# Patient Record
Sex: Female | Born: 1935 | Race: Black or African American | Hispanic: No | Marital: Married | Smoking: Never smoker
Health system: Southern US, Community
[De-identification: ages and names within clinical notes are randomized; demographics above are authoritative.]

## PROBLEM LIST (undated history)

## (undated) DIAGNOSIS — E785 Hyperlipidemia, unspecified: Secondary | ICD-10-CM

## (undated) DIAGNOSIS — K219 Gastro-esophageal reflux disease without esophagitis: Secondary | ICD-10-CM

## (undated) DIAGNOSIS — I10 Essential (primary) hypertension: Secondary | ICD-10-CM

## (undated) HISTORY — PX: ABDOMINAL HYSTERECTOMY: SHX81

---

## 2005-03-28 ENCOUNTER — Emergency Department: Payer: Self-pay | Admitting: Emergency Medicine

## 2008-10-25 ENCOUNTER — Encounter: Admission: RE | Admit: 2008-10-25 | Discharge: 2008-10-25 | Payer: Self-pay | Admitting: Otolaryngology

## 2010-01-07 ENCOUNTER — Ambulatory Visit: Payer: Self-pay | Admitting: Family Medicine

## 2010-01-07 DIAGNOSIS — J069 Acute upper respiratory infection, unspecified: Secondary | ICD-10-CM | POA: Insufficient documentation

## 2010-01-07 DIAGNOSIS — E785 Hyperlipidemia, unspecified: Secondary | ICD-10-CM

## 2010-01-07 DIAGNOSIS — I10 Essential (primary) hypertension: Secondary | ICD-10-CM | POA: Insufficient documentation

## 2010-08-11 NOTE — Assessment & Plan Note (Signed)
Summary: COLD/JBB   Vital Signs:  Patient Profile:   76 Years Old Female CC:      Cold & URI symptoms Height:     65.25 inches Weight:      218 pounds BMI:     36.13 O2 Sat:      98 % O2 treatment:    Room Air Temp:     97.6 degrees F oral Pulse rate:   68 / minute Pulse rhythm:   regular Resp:     18 per minute BP sitting:   124 / 78  (left arm)  Pt. in pain?   no  Vitals Entered By: Levonne Spiller EMT-P (January 07, 2010 12:55 PM)              Is Patient Diabetic? No      Current Allergies: No known allergies History of Present Illness History from: patient Reason for visit: see chief complaint Chief Complaint: Cold & URI symptoms History of Present Illness: For the last 2 weeks has been having a chronic runny nose and cough. The cough is non productive and worse at night. It has felt like it was getting better, but has lingered. she works at a daycare.  No f/c. No sore throat. + hoarseness.  REVIEW OF SYSTEMS Constitutional Symptoms      Denies fever, chills, night sweats, weight loss, weight gain, and fatigue.  Eyes       Denies change in vision, eye pain, eye discharge, glasses, contact lenses, and eye surgery. Ear/Nose/Throat/Mouth       Complains of sinus problems.      Denies hearing loss/aids, change in hearing, ear pain, ear discharge, dizziness, frequent runny nose, frequent nose bleeds, sore throat, hoarseness, and tooth pain or bleeding.  Respiratory       Complains of dry cough.      Denies productive cough, wheezing, shortness of breath, asthma, bronchitis, and emphysema/COPD.  Cardiovascular       Denies murmurs, chest pain, and tires easily with exhertion.    Gastrointestinal       Denies stomach pain, nausea/vomiting, diarrhea, constipation, blood in bowel movements, and indigestion. Genitourniary       Denies painful urination, kidney stones, and loss of urinary control. Neurological       Denies paralysis, seizures, and  fainting/blackouts. Musculoskeletal       Denies muscle pain, joint pain, joint stiffness, decreased range of motion, redness, swelling, muscle weakness, and gout.  Skin       Denies bruising, unusual mles/lumps or sores, and hair/skin or nail changes.  Psych       Denies mood changes, temper/anger issues, anxiety/stress, speech problems, depression, and sleep problems.  Past History:  Past Medical History: Hyperlipidemia Hypertension  Social History: Occupation: Building surveyor Never Smoked Alcohol use-no Smoking Status:  never Physical Exam General appearance: well developed, well nourished, no acute distress - hoarse voice Head: no sinus tenderness Eyes: conjunctivae and lids normal Oral/Pharynx: post nasal drainage - clear. + erythema Neck: supple,anterior lymphadenopathy present - nontender Chest/Lungs: no rales, wheezes, or rhonchi bilateral, breath sounds equal without effort Heart: regular rate and  rhythm, no murmur MSE: oriented to time, place, and person Assessment New Problems: UPPER RESPIRATORY INFECTION, ACUTE (ICD-465.9) HYPERTENSION (ICD-401.9) HYPERLIPIDEMIA (ICD-272.4)   Plan New Medications/Changes: PROMETHAZINE-DM 6.25-15 MG/5ML SYRP (PROMETHAZINE-DM) 1-2 tsp by mouth Q6 hours as needed cough  #120 x 0, 01/07/2010, Tacey Ruiz MD AMOXICILLIN 500 MG CAPS (AMOXICILLIN) 1 by mouth two times a day  x 10 days  #20 x 0, 01/07/2010, Tacey Ruiz MD  New Orders: New Patient Level II [phone]  The patient and/or caregiver has been counseled thoroughly with regard to medications prescribed including dosage, schedule, interactions, rationale for use, and possible side effects and they verbalize understanding.  Diagnoses and expected course of recovery discussed and will return if not improved as expected or if the condition worsens. Patient and/or caregiver verbalized understanding.  Prescriptions: PROMETHAZINE-DM 6.25-15 MG/5ML SYRP (PROMETHAZINE-DM) 1-2 tsp by mouth  Q6 hours as needed cough  #120 x 0   Entered and Authorized by:   Tacey Ruiz MD   Signed by:   Tacey Ruiz MD on 01/07/2010   Method used:   Electronically to        [facility] [address] (retail)       [address], [address]       [place], [place]  [address]       Ph: [phone]       Fax: [phone]   RxID:   [id number] AMOXICILLIN 500 MG CAPS (AMOXICILLIN) 1 by mouth two times a day x 10 days  #20 x 0   Entered and Authorized by:   Tacey Ruiz MD   Signed by:   Tacey Ruiz MD on 01/07/2010   Method used:   Electronically to        [facility] [address] (retail)       [address], [address]       [place], [place]  [address]       Ph: [phone]       Fax: [phone]   RxID:   [id number]   Orders Added: 1)  New Patient Level II [99202]  The risks, benefits and possible side effects of the treatments and tests were explained clearly to the patient and the patient verbalized understanding.  The patient was informed that there is no on-call provider or services available at this clinic during off-hours (when the clinic is closed).  If the patient developed a problem or concern that required immediate attention, the patient was advised to go the the nearest available urgent care or emergency department for medical care.  The patient verbalized understanding.

## 2010-09-08 ENCOUNTER — Emergency Department: Payer: Self-pay

## 2019-04-27 ENCOUNTER — Other Ambulatory Visit: Payer: Self-pay

## 2019-04-27 DIAGNOSIS — Z20822 Contact with and (suspected) exposure to covid-19: Secondary | ICD-10-CM

## 2019-04-29 LAB — NOVEL CORONAVIRUS, NAA: SARS-CoV-2, NAA: NOT DETECTED

## 2019-05-17 ENCOUNTER — Other Ambulatory Visit: Payer: Self-pay | Admitting: *Deleted

## 2019-05-17 DIAGNOSIS — Z20822 Contact with and (suspected) exposure to covid-19: Secondary | ICD-10-CM

## 2019-05-18 LAB — NOVEL CORONAVIRUS, NAA: SARS-CoV-2, NAA: NOT DETECTED

## 2019-08-20 ENCOUNTER — Ambulatory Visit: Payer: Medicare Other | Attending: Internal Medicine

## 2019-08-20 DIAGNOSIS — Z23 Encounter for immunization: Secondary | ICD-10-CM | POA: Insufficient documentation

## 2019-09-14 ENCOUNTER — Ambulatory Visit: Payer: Medicare Other | Attending: Internal Medicine

## 2019-09-14 DIAGNOSIS — Z23 Encounter for immunization: Secondary | ICD-10-CM | POA: Insufficient documentation

## 2019-09-14 NOTE — Progress Notes (Signed)
   Covid-19 Vaccination Clinic  Name:  Sarah Higgins    MRN: [id number] DOB: 1934/10/05  09/14/2019  Sarah Higgins was observed post Covid-19 immunization for 15 minutes without incident. She was provided with Vaccine Information Sheet and instruction to access the V-Safe system.   Sarah Higgins was instructed to call 911 with any severe reactions post vaccine: Marland Kitchen Difficulty breathing  . Swelling of face and throat  . A fast heartbeat  . A bad rash all over body  . Dizziness and weakness   Immunizations Administered    Name Date Dose VIS Date Route   Pfizer COVID-19 Vaccine 09/14/2019  1:46 PM 0.3 mL 06/22/2019 Intramuscular   Manufacturer: [employer], [employer]   Lot: [id number]   NDC: [address]-5

## 2020-11-29 ENCOUNTER — Emergency Department: Payer: Medicare Other

## 2020-11-29 ENCOUNTER — Inpatient Hospital Stay
Admission: EM | Admit: 2020-11-29 | Discharge: 2020-12-03 | DRG: 494 | Disposition: A | Discharge status: Skilled Nursing Facility | Payer: Medicare Other | Attending: Internal Medicine | Admitting: Internal Medicine

## 2020-11-29 ENCOUNTER — Other Ambulatory Visit: Payer: Self-pay

## 2020-11-29 ENCOUNTER — Inpatient Hospital Stay: Payer: Medicare Other

## 2020-11-29 ENCOUNTER — Encounter: Payer: Self-pay | Admitting: Orthopedic Surgery

## 2020-11-29 DIAGNOSIS — T17908A Unspecified foreign body in respiratory tract, part unspecified causing other injury, initial encounter: Secondary | ICD-10-CM

## 2020-11-29 DIAGNOSIS — W19XXXA Unspecified fall, initial encounter: Secondary | ICD-10-CM | POA: Diagnosis present

## 2020-11-29 DIAGNOSIS — Z8249 Family history of ischemic heart disease and other diseases of the circulatory system: Secondary | ICD-10-CM | POA: Diagnosis not present

## 2020-11-29 DIAGNOSIS — S82892A Other fracture of left lower leg, initial encounter for closed fracture: Secondary | ICD-10-CM | POA: Insufficient documentation

## 2020-11-29 DIAGNOSIS — S82852D Displaced trimalleolar fracture of left lower leg, subsequent encounter for closed fracture with routine healing: Secondary | ICD-10-CM | POA: Diagnosis not present

## 2020-11-29 DIAGNOSIS — I10 Essential (primary) hypertension: Secondary | ICD-10-CM | POA: Diagnosis present

## 2020-11-29 DIAGNOSIS — Z20822 Contact with and (suspected) exposure to covid-19: Secondary | ICD-10-CM | POA: Diagnosis present

## 2020-11-29 DIAGNOSIS — N182 Chronic kidney disease, stage 2 (mild): Secondary | ICD-10-CM | POA: Diagnosis present

## 2020-11-29 DIAGNOSIS — I129 Hypertensive chronic kidney disease with stage 1 through stage 4 chronic kidney disease, or unspecified chronic kidney disease: Secondary | ICD-10-CM | POA: Diagnosis present

## 2020-11-29 DIAGNOSIS — I340 Nonrheumatic mitral (valve) insufficiency: Secondary | ICD-10-CM | POA: Diagnosis not present

## 2020-11-29 DIAGNOSIS — S9305XA Dislocation of left ankle joint, initial encounter: Secondary | ICD-10-CM

## 2020-11-29 DIAGNOSIS — D72829 Elevated white blood cell count, unspecified: Secondary | ICD-10-CM | POA: Diagnosis present

## 2020-11-29 DIAGNOSIS — Z90710 Acquired absence of both cervix and uterus: Secondary | ICD-10-CM | POA: Diagnosis not present

## 2020-11-29 DIAGNOSIS — E876 Hypokalemia: Secondary | ICD-10-CM

## 2020-11-29 DIAGNOSIS — K219 Gastro-esophageal reflux disease without esophagitis: Secondary | ICD-10-CM | POA: Diagnosis present

## 2020-11-29 DIAGNOSIS — W1830XA Fall on same level, unspecified, initial encounter: Secondary | ICD-10-CM | POA: Diagnosis present

## 2020-11-29 DIAGNOSIS — S82852A Displaced trimalleolar fracture of left lower leg, initial encounter for closed fracture: Principal | ICD-10-CM | POA: Diagnosis present

## 2020-11-29 DIAGNOSIS — K08109 Complete loss of teeth, unspecified cause, unspecified class: Secondary | ICD-10-CM | POA: Diagnosis present

## 2020-11-29 DIAGNOSIS — S8264XA Nondisplaced fracture of lateral malleolus of right fibula, initial encounter for closed fracture: Secondary | ICD-10-CM | POA: Diagnosis present

## 2020-11-29 DIAGNOSIS — R55 Syncope and collapse: Secondary | ICD-10-CM | POA: Diagnosis present

## 2020-11-29 DIAGNOSIS — M25559 Pain in unspecified hip: Secondary | ICD-10-CM

## 2020-11-29 DIAGNOSIS — Z79899 Other long term (current) drug therapy: Secondary | ICD-10-CM | POA: Diagnosis not present

## 2020-11-29 DIAGNOSIS — Y9301 Activity, walking, marching and hiking: Secondary | ICD-10-CM | POA: Diagnosis present

## 2020-11-29 DIAGNOSIS — I351 Nonrheumatic aortic (valve) insufficiency: Secondary | ICD-10-CM | POA: Diagnosis not present

## 2020-11-29 DIAGNOSIS — T148XXA Other injury of unspecified body region, initial encounter: Secondary | ICD-10-CM

## 2020-11-29 DIAGNOSIS — R609 Edema, unspecified: Secondary | ICD-10-CM

## 2020-11-29 DIAGNOSIS — E785 Hyperlipidemia, unspecified: Secondary | ICD-10-CM | POA: Diagnosis present

## 2020-11-29 HISTORY — DX: Essential (primary) hypertension: I10

## 2020-11-29 HISTORY — DX: Gastro-esophageal reflux disease without esophagitis: K21.9

## 2020-11-29 HISTORY — DX: Hyperlipidemia, unspecified: E78.5

## 2020-11-29 LAB — CBC WITH DIFFERENTIAL/PLATELET
Abs Immature Granulocytes: 0.09 10*3/uL — ABNORMAL HIGH (ref 0.00–0.07)
Basophils Absolute: 0.1 10*3/uL (ref 0.0–0.1)
Basophils Relative: 0 %
Eosinophils Absolute: 0.3 10*3/uL (ref 0.0–0.5)
Eosinophils Relative: 2 %
HCT: 39.7 % (ref 36.0–46.0)
Hemoglobin: 12.8 g/dL (ref 12.0–15.0)
Immature Granulocytes: 1 %
Lymphocytes Relative: 27 %
Lymphs Abs: 4.7 10*3/uL — ABNORMAL HIGH (ref 0.7–4.0)
MCH: 27.2 pg (ref 26.0–34.0)
MCHC: 32.2 g/dL (ref 30.0–36.0)
MCV: 84.3 fL (ref 80.0–100.0)
Monocytes Absolute: 1.6 10*3/uL — ABNORMAL HIGH (ref 0.1–1.0)
Monocytes Relative: 9 %
Neutro Abs: 10.5 10*3/uL — ABNORMAL HIGH (ref 1.7–7.7)
Neutrophils Relative %: 61 %
Platelets: 236 10*3/uL (ref 150–400)
RBC: 4.71 MIL/uL (ref 3.87–5.11)
RDW: 12.6 % (ref 11.5–15.5)
Smear Review: NORMAL
WBC: 17.2 10*3/uL — ABNORMAL HIGH (ref 4.0–10.5)
nRBC: 0 % (ref 0.0–0.2)

## 2020-11-29 LAB — TROPONIN I (HIGH SENSITIVITY): Troponin I (High Sensitivity): 5 ng/L (ref <18)

## 2020-11-29 LAB — COMPREHENSIVE METABOLIC PANEL
ALT: 16 U/L (ref 0–44)
AST: 20 U/L (ref 15–41)
Albumin: 3.8 g/dL (ref 3.5–5.0)
Alkaline Phosphatase: 83 U/L (ref 38–126)
Anion gap: 8 (ref 5–15)
BUN: 15 mg/dL (ref 8–23)
CO2: 28 mmol/L (ref 22–32)
Calcium: 9.2 mg/dL (ref 8.9–10.3)
Chloride: 102 mmol/L (ref 98–111)
Creatinine, Ser: 1.06 mg/dL — ABNORMAL HIGH (ref 0.44–1.00)
GFR, Estimated: 52 mL/min — ABNORMAL LOW (ref >60)
Glucose, Bld: 99 mg/dL (ref 70–99)
Potassium: 3.5 mmol/L (ref 3.5–5.1)
Sodium: 138 mmol/L (ref 135–145)
Total Bilirubin: 0.7 mg/dL (ref 0.3–1.2)
Total Protein: 7.7 g/dL (ref 6.5–8.1)

## 2020-11-29 LAB — LIPASE, BLOOD: Lipase: 73 U/L — ABNORMAL HIGH (ref 11–51)

## 2020-11-29 LAB — CBG MONITORING, ED: Glucose-Capillary: 101 mg/dL — ABNORMAL HIGH (ref 70–99)

## 2020-11-29 LAB — BRAIN NATRIURETIC PEPTIDE: B Natriuretic Peptide: 27.2 pg/mL (ref 0.0–100.0)

## 2020-11-29 LAB — SURGICAL PCR SCREEN
MRSA, PCR: POSITIVE — AB
Staphylococcus aureus: POSITIVE — AB

## 2020-11-29 LAB — TYPE AND SCREEN
ABO/RH(D): O POS
Antibody Screen: NEGATIVE

## 2020-11-29 LAB — RESP PANEL BY RT-PCR (FLU A&B, COVID) ARPGX2
Influenza A by PCR: NEGATIVE
Influenza B by PCR: NEGATIVE
SARS Coronavirus 2 by RT PCR: NEGATIVE

## 2020-11-29 LAB — PROTIME-INR
INR: 1.1 (ref 0.8–1.2)
Prothrombin Time: 14.4 seconds (ref 11.4–15.2)

## 2020-11-29 LAB — APTT: aPTT: 28 seconds (ref 24–36)

## 2020-11-29 LAB — MAGNESIUM: Magnesium: 1.6 mg/dL — ABNORMAL LOW (ref 1.7–2.4)

## 2020-11-29 MED ORDER — VENLAFAXINE HCL ER 150 MG PO CP24
150.0000 mg | ORAL_CAPSULE | Freq: Every day | ORAL | Status: DC
  Administered 2020-11-30 – 2020-12-03 (×4): 150 mg via ORAL
  Filled 2020-11-29 (×4): qty 1

## 2020-11-29 MED ORDER — LOPERAMIDE HCL 2 MG PO CAPS: 4.0000 mg | ORAL_CAPSULE | Freq: Two times a day (BID) | ORAL | Status: DC | PRN

## 2020-11-29 MED ORDER — METHOCARBAMOL 500 MG PO TABS
500.0000 mg | ORAL_TABLET | Freq: Three times a day (TID) | ORAL | Status: DC | PRN
  Administered 2020-11-30 (×2): 500 mg via ORAL
  Filled 2020-11-29 (×3): qty 1

## 2020-11-29 MED ORDER — MORPHINE SULFATE (PF) 4 MG/ML IV SOLN
4.0000 mg | Freq: Once | INTRAVENOUS | Status: AC
  Administered 2020-11-29: 4 mg via INTRAVENOUS

## 2020-11-29 MED ORDER — LEVOCETIRIZINE DIHYDROCHLORIDE 5 MG PO TABS: 5.0000 mg | ORAL_TABLET | Freq: Every day | ORAL | Status: DC

## 2020-11-29 MED ORDER — LORATADINE 10 MG PO TABS
10.0000 mg | ORAL_TABLET | Freq: Every day | ORAL | Status: DC
  Administered 2020-11-30 – 2020-12-03 (×4): 10 mg via ORAL
  Filled 2020-11-29 (×4): qty 1

## 2020-11-29 MED ORDER — SODIUM CHLORIDE 0.9 % IV SOLN: INTRAVENOUS | Status: DC

## 2020-11-29 MED ORDER — METHOCARBAMOL 500 MG PO TABS
500.0000 mg | ORAL_TABLET | Freq: Four times a day (QID) | ORAL | Status: DC | PRN
  Filled 2020-11-29: qty 1

## 2020-11-29 MED ORDER — GABAPENTIN 100 MG PO CAPS
200.0000 mg | ORAL_CAPSULE | Freq: Three times a day (TID) | ORAL | Status: DC
  Administered 2020-11-29 – 2020-12-03 (×10): 200 mg via ORAL
  Filled 2020-11-29 (×10): qty 2

## 2020-11-29 MED ORDER — FLUTICASONE PROPIONATE 50 MCG/ACT NA SUSP
1.0000 | Freq: Every day | NASAL | Status: DC | PRN
  Filled 2020-11-29: qty 16

## 2020-11-29 MED ORDER — MORPHINE SULFATE (PF) 4 MG/ML IV SOLN
4.0000 mg | Freq: Once | INTRAVENOUS | Status: DC
  Filled 2020-11-29: qty 1

## 2020-11-29 MED ORDER — BISACODYL 5 MG PO TBEC: 5.0000 mg | DELAYED_RELEASE_TABLET | Freq: Every day | ORAL | Status: DC | PRN

## 2020-11-29 MED ORDER — HYDROCODONE-ACETAMINOPHEN 5-325 MG PO TABS
1.0000 | ORAL_TABLET | Freq: Four times a day (QID) | ORAL | Status: DC | PRN
Start: 2020-11-29 — End: 2020-11-29

## 2020-11-29 MED ORDER — DOCUSATE SODIUM 100 MG PO CAPS: 100.0000 mg | ORAL_CAPSULE | Freq: Two times a day (BID) | ORAL | Status: DC

## 2020-11-29 MED ORDER — MAGNESIUM SULFATE 2 GM/50ML IV SOLN
2.0000 g | Freq: Once | INTRAVENOUS | Status: AC
  Administered 2020-11-29: 2 g via INTRAVENOUS
  Filled 2020-11-29: qty 50

## 2020-11-29 MED ORDER — OXYBUTYNIN CHLORIDE ER 5 MG PO TB24
10.0000 mg | ORAL_TABLET | Freq: Every day | ORAL | Status: DC
  Administered 2020-11-30 – 2020-12-03 (×4): 10 mg via ORAL
  Filled 2020-11-29 (×3): qty 2
  Filled 2020-11-29: qty 1

## 2020-11-29 MED ORDER — METHOCARBAMOL 1000 MG/10ML IJ SOLN
500.0000 mg | Freq: Four times a day (QID) | INTRAVENOUS | Status: DC | PRN
  Filled 2020-11-29: qty 5

## 2020-11-29 MED ORDER — BENZONATATE 100 MG PO CAPS: 100.0000 mg | ORAL_CAPSULE | Freq: Three times a day (TID) | ORAL | Status: DC | PRN

## 2020-11-29 MED ORDER — PRAVASTATIN SODIUM 20 MG PO TABS
20.0000 mg | ORAL_TABLET | Freq: Every day | ORAL | Status: DC
  Administered 2020-11-29 – 2020-12-02 (×4): 20 mg via ORAL
  Filled 2020-11-29 (×4): qty 1

## 2020-11-29 MED ORDER — SENNOSIDES-DOCUSATE SODIUM 8.6-50 MG PO TABS: 1.0000 | ORAL_TABLET | Freq: Every evening | ORAL | Status: DC | PRN

## 2020-11-29 MED ORDER — CEFAZOLIN SODIUM-DEXTROSE 2-4 GM/100ML-% IV SOLN
2.0000 g | INTRAVENOUS | Status: AC
  Administered 2020-11-30: 2 g via INTRAVENOUS
  Filled 2020-11-29 (×2): qty 100

## 2020-11-29 MED ORDER — FLEET ENEMA 7-19 GM/118ML RE ENEM: 1.0000 | ENEMA | Freq: Once | RECTAL | Status: DC | PRN

## 2020-11-29 MED ORDER — ONDANSETRON HCL 4 MG/2ML IJ SOLN: 4.0000 mg | Freq: Three times a day (TID) | INTRAMUSCULAR | Status: DC | PRN

## 2020-11-29 MED ORDER — LATANOPROST 0.005 % OP SOLN
1.0000 [drp] | Freq: Every day | OPHTHALMIC | Status: DC
  Administered 2020-11-29 – 2020-12-02 (×4): 1 [drp] via OPHTHALMIC
  Filled 2020-11-29: qty 2.5

## 2020-11-29 MED ORDER — HYDRALAZINE HCL 20 MG/ML IJ SOLN: 5.0000 mg | INTRAMUSCULAR | Status: DC | PRN

## 2020-11-29 MED ORDER — PANTOPRAZOLE SODIUM 40 MG PO TBEC
40.0000 mg | DELAYED_RELEASE_TABLET | Freq: Every day | ORAL | Status: DC
  Administered 2020-11-30 – 2020-12-03 (×4): 40 mg via ORAL
  Filled 2020-11-29 (×4): qty 1

## 2020-11-29 MED ORDER — MORPHINE SULFATE (PF) 2 MG/ML IV SOLN
0.5000 mg | INTRAVENOUS | Status: DC | PRN
  Administered 2020-11-30 (×3): 0.5 mg via INTRAVENOUS
  Filled 2020-11-29 (×3): qty 1

## 2020-11-29 MED ORDER — OXYCODONE-ACETAMINOPHEN 5-325 MG PO TABS
1.0000 | ORAL_TABLET | ORAL | Status: DC | PRN
  Administered 2020-11-29 – 2020-11-30 (×2): 1 via ORAL
  Filled 2020-11-29 (×2): qty 1

## 2020-11-29 MED ORDER — ACETAMINOPHEN 325 MG PO TABS
650.0000 mg | ORAL_TABLET | Freq: Four times a day (QID) | ORAL | Status: DC | PRN
  Administered 2020-11-30 – 2020-12-01 (×3): 650 mg via ORAL
  Filled 2020-11-29 (×4): qty 2

## 2020-11-29 MED ORDER — MORPHINE SULFATE (PF) 2 MG/ML IV SOLN: 2.0000 mg | INTRAVENOUS | Status: DC | PRN

## 2020-11-29 NOTE — ED Notes (Signed)
Ortho cart placed by room.

## 2020-11-29 NOTE — ED Notes (Signed)
Left ankle elevated and ice pack applied. Capillary refill <3 seconds, skin is warm, CMS is intact. Obvious deformity noted.

## 2020-11-29 NOTE — Plan of Care (Signed)
Lab contacted to inform of positive MRSA (nares) - Dr. And floor RN Jacinto Halim) informed.

## 2020-11-29 NOTE — ED Provider Notes (Addendum)
[facility] Emergency Department Provider Note  ____________________________________________   Event Date/Time   First MD Initiated Contact with Patient 11/29/20 1624     (approximate)  I have reviewed the triage vital signs and the nursing notes.   HISTORY  Chief Complaint Loss of Consciousness    HPI Sarah Higgins is a 86 y.o. female who presents by EMS after a syncopal episode, collapse, and obvious ankle injury.  She reports that she was walking to the bathroom when she suddenly passed out.  She fell and does not believe she hit her head or anything else, but she twisted her ankle and has severe sharp ankle pain  which is worse if she moves it.  She has no other pain.  At no point has she had chest pain or shortness of breath.  She has had no nausea or vomiting.  She denies headache, neck pain, and back pain.  No recent dysuria.  Nothing in particular makes her symptoms better.  She has never had passing out episodes before.  She is not on blood thinners.        Past Medical History:  Diagnosis Date  . HLD (hyperlipidemia)   . Hypertension     Patient Active Problem List   Diagnosis Date Noted  . Trimalleolar fracture of left ankle 11/29/2020  . HLD (hyperlipidemia) 11/29/2020  . Syncope 11/29/2020  . Fall 11/29/2020  . Hypomagnesemia 11/29/2020  . Closed left ankle fracture 11/29/2020  . HYPERLIPIDEMIA 01/07/2010  . HTN (hypertension) 01/07/2010  . UPPER RESPIRATORY INFECTION, ACUTE 01/07/2010    History reviewed. No pertinent surgical history.  Prior to Admission medications   Medication Sig Start Date End Date Taking? Authorizing Provider  benzonatate (TESSALON) 100 MG capsule Take 100 mg by mouth 3 (three) times daily as needed for cough.   Yes [provider]  fluticasone (FLONASE) 50 MCG/ACT nasal spray Place 1 spray into both nostrils daily.   Yes [provider]  gabapentin (NEURONTIN) 100 MG capsule Take 200 mg  by mouth 3 (three) times daily.   Yes [provider]  latanoprost (XALATAN) 0.005 % ophthalmic solution Place 1 drop into both eyes at bedtime.   Yes [provider]  levocetirizine (XYZAL) 5 MG tablet Take 5 mg by mouth daily.   Yes [provider]  loperamide (IMODIUM) 2 MG capsule Take 4 mg by mouth 2 (two) times daily as needed for diarrhea or loose stools.   Yes [provider]  lovastatin (MEVACOR) 20 MG tablet Take 20 mg by mouth every evening.   Yes [provider]  omeprazole (PRILOSEC) 40 MG capsule Take 40 mg by mouth daily.   Yes [provider]  oxybutynin (DITROPAN-XL) 10 MG 24 hr tablet Take 10 mg by mouth daily.   Yes [provider]  sodium chloride HYPERTONIC 3 % nebulizer solution Take 4 mLs by nebulization 2 (two) times daily.   Yes [provider]  venlafaxine XR (EFFEXOR-XR) 150 MG 24 hr capsule Take 150 mg by mouth daily.   Yes [provider]    Allergies Patient has no known allergies.  History reviewed. No pertinent family history.  Social History Social History   Tobacco Use  . Smoking status: Never Smoker  . Smokeless tobacco: Never Used    Review of Systems Constitutional: No fever/chills Eyes: No visual changes. ENT: No sore throat. Cardiovascular: Syncope and collapse.  Denies chest pain. Respiratory: Denies shortness of breath. Gastrointestinal: No abdominal  pain.  No nausea, no vomiting.  No diarrhea.  No constipation. Genitourinary: Negative for dysuria. Musculoskeletal: Left ankle pain with obvious deformity. Integumentary: Negative for rash. Neurological: Negative for headaches, focal weakness or numbness.   ____________________________________________   PHYSICAL EXAM:  VITAL SIGNS: ED Triage Vitals  Enc Vitals Group     BP 11/29/20 1512 (!) 91/50     Pulse Rate 11/29/20 1512 80     Resp 11/29/20 1512 16     Temp 11/29/20 1512 98.2 F (36.8 C)      Temp Source 11/29/20 1512 Oral     SpO2 11/29/20 1512 94 %     Weight 11/29/20 1507 86 kg (189 lb 9.5 oz)     Height 11/29/20 1507 1.702 m (5\' 7" )     Head Circumference --      Peak Flow --      Pain Score 11/29/20 1525 3     Pain Loc --      Pain Edu? --      Excl. in GC? --     Constitutional: Alert and oriented.  Eyes: Conjunctivae are normal.  Head: Atraumatic. Nose: No congestion/rhinnorhea. Mouth/Throat: Patient is wearing a mask. Neck: No stridor.  No meningeal signs.   Cardiovascular: Normal rate, regular rhythm. Good peripheral circulation. Respiratory: Normal respiratory effort.  No retractions. Gastrointestinal: Soft and nontender. No distention.  Musculoskeletal: Obvious left ankle deformity consistent with fracture/dislocation.  Easily palpable distal pulses in the top of the foot.  There is some tenting of the medial malleolus but it is not an open fracture/dislocation.  No other obvious musculoskeletal issues.  She has no tenderness to palpation along the cervical spine.  No upper extremity issues. Neurologic:  Normal speech and language. No gross focal neurologic deficits are appreciated.  Skin:  Skin is warm, dry and intact. Psychiatric: Mood and affect are normal. Speech and behavior are normal.  ____________________________________________   LABS (all labs ordered are listed, but only abnormal results are displayed)  Labs Reviewed  CBC WITH DIFFERENTIAL/PLATELET - Abnormal; Notable for the following components:      Result Value   WBC 17.2 (*)    Neutro Abs 10.5 (*)    Lymphs Abs 4.7 (*)    Monocytes Absolute 1.6 (*)    Abs Immature Granulocytes 0.09 (*)    All other components within normal limits  COMPREHENSIVE METABOLIC PANEL - Abnormal; Notable for the following components:   Creatinine, Ser 1.06 (*)    GFR, Estimated 52 (*)    All other components within normal limits  LIPASE, BLOOD - Abnormal; Notable for the following components:   Lipase 73 (*)     All other components within normal limits  MAGNESIUM - Abnormal; Notable for the following components:   Magnesium 1.6 (*)    All other components within normal limits  CBG MONITORING, ED - Abnormal; Notable for the following components:   Glucose-Capillary 101 (*)    All other components within normal limits  RESP PANEL BY RT-PCR (FLU A&B, COVID) ARPGX2  SURGICAL PCR SCREEN  PROTIME-INR  APTT  BRAIN NATRIURETIC PEPTIDE  CBC  TYPE AND SCREEN  TROPONIN I (HIGH SENSITIVITY)   ____________________________________________  EKG  ED ECG REPORT I, 12/01/20, the attending physician, personally viewed and interpreted this ECG.  Date: 11/29/2020 EKG Time: 15:09 Rate: 77 Rhythm: normal sinus rhythm QRS Axis: normal Intervals: normal ST/T Wave abnormalities: normal Narrative Interpretation: no evidence of acute ischemia    ____________________________________________  RADIOLOGY  I, Loleta Roseory Alden Bensinger, personally viewed and evaluated these images (plain radiographs) as part of my medical decision making, as well as reviewing the written report by the radiologist.  ED MD interpretation: Left trimalleolar fracture  Official radiology report(s): DG Ankle Complete Left  Result Date: 11/29/2020 CLINICAL DATA:  Fall, pain, deformity EXAM: LEFT ANKLE COMPLETE - 3+ VIEW COMPARISON:  None. FINDINGS: There is fracture dislocation at the left ankle. Distal fibular metaphyseal fracture and medial malleolar fracture. Possible posterior malleolar fracture although difficult to determine/visualize. Lateral dislocation of the talus relative to the tibia. IMPRESSION: Left ankle fracture dislocation as above. Electronically Signed   By: Charlett NoseKevin  Dover M.D.   On: 11/29/2020 15:30    ____________________________________________   PROCEDURES   Procedure(s) performed (including Critical Care):  .Ortho Injury Treatment  Date/Time: 11/29/2020 5:03 PM Performed by: Loleta RoseForbach, Adeyemi Hamad, MD Authorized by:  Loleta RoseForbach, Butler Vegh, MD   Consent:    Consent obtained:  Verbal and emergent situation   Consent given by:  Patient   Risks discussed:  Fracture, irreducible dislocation and nerve damageInjury location: ankle Location details: left ankle Injury type: fracture-dislocation Fracture type: trimalleolar Pre-procedure neurovascular assessment: neurovascularly intact Pre-procedure distal perfusion: normal Pre-procedure neurological function: normal Pre-procedure range of motion: reduced  Anesthesia: Local anesthesia used: no  Patient sedated: NoManipulation performed: yes Reduction successful: yes (improved position but still not ideal) X-ray confirmed reduction: yes Immobilization: splint Splint type: short leg Splint Applied by: ED Provider and ED Tech Supplies used: Ortho-Glass Post-procedure neurovascular assessment: post-procedure neurovascularly intact Post-procedure distal perfusion: normal Post-procedure neurological function: normal Post-procedure range of motion: unchanged Comments: Postreduction, the patient is able to wiggle her toes, has no significant pain, no loss of sensation, and still has palpable pulse at the top of her foot.  Marland Kitchen.1-3 Lead EKG Interpretation Performed by: Loleta RoseForbach, Phong Isenberg, MD Authorized by: Loleta RoseForbach, Emillio Ngo, MD     Interpretation: normal     ECG rate:  79   Rhythm: sinus rhythm     Ectopy: none     Conduction: normal       ____________________________________________   INITIAL IMPRESSION / MDM / ASSESSMENT AND PLAN / ED COURSE  As part of my medical decision making, I reviewed the following data within the electronic MEDICAL RECORD NUMBER Nursing notes reviewed and incorporated, Labs reviewed , EKG interpreted , Old chart reviewed, Discussed with admitting physician , Discussed with hospitalist , consult to orthopedics, and reviewed Notes from prior ED visits   Differential diagnosis includes, but is not limited to, fracture, dislocation, cardiogenic  syncope, vasovagal episode, orthostatic episode, acute infection, PE, CVA.  The patient is on the cardiac monitor to evaluate for evidence of arrhythmia and/or significant heart rate changes.  COVID test negative, CBC notable for a mild leukocytosis of unclear significance, slightly decreased magnesium, normal troponin, slightly elevated lipase of unclear significance in the absence of abdominal pain.  CMP is normal.  Patient is asymptomatic other than ankle pain.  I called and discussed case by phone with Dr. Martha ClanKrasinski.  He asked me to reduce the ankle and he will admit the patient.  Hospitalist is consulted for preoperative clearance.  Anticipation is for surgery tomorrow.         ____________________________________________  FINAL CLINICAL IMPRESSION(S) / ED DIAGNOSES  Final diagnoses:  Closed left trimalleolar fracture, initial encounter  Ankle dislocation, left, initial encounter  Syncope and collapse     MEDICATIONS GIVEN DURING THIS VISIT:  Medications  0.9 %  sodium chloride infusion (has no administration  in time range)  senna-docusate (Senokot-S) tablet 1 tablet (has no administration in time range)  ceFAZolin (ANCEF) IVPB 2g/100 mL premix (has no administration in time range)  magnesium sulfate IVPB 2 g 50 mL (2 g Intravenous New Bag/Given 11/29/20 1702)  morphine 2 MG/ML injection 0.5 mg (has no administration in time range)  oxyCODONE-acetaminophen (PERCOCET/ROXICET) 5-325 MG per tablet 1 tablet (has no administration in time range)  methocarbamol (ROBAXIN) tablet 500 mg (has no administration in time range)  ondansetron (ZOFRAN) injection 4 mg (has no administration in time range)  acetaminophen (TYLENOL) tablet 650 mg (has no administration in time range)  hydrALAZINE (APRESOLINE) injection 5 mg (has no administration in time range)  morphine 4 MG/ML injection 4 mg (4 mg Intravenous Given 11/29/20 1724)     ED Discharge Orders    None      *Please note:   Sarah Higgins was evaluated in Emergency Department on 11/29/2020 for the symptoms described in the history of present illness. She was evaluated in the context of the global COVID-19 pandemic, which necessitated consideration that the patient might be at risk for infection with the SARS-CoV-2 virus that causes COVID-19. Institutional protocols and algorithms that pertain to the evaluation of patients at risk for COVID-19 are in a state of rapid change based on information released by regulatory bodies including the CDC and federal and state organizations. These policies and algorithms were followed during the patient's care in the ED.  Some ED evaluations and interventions may be delayed as a result of limited staffing during and after the pandemic.*  Note:  This document was prepared using Dragon voice recognition software and may include unintentional dictation errors.   Loleta Rose, MD 11/29/20 Clelia Croft    Loleta Rose, MD 11/29/20 [phone]

## 2020-11-29 NOTE — ED Notes (Signed)
Surgical consent transcribed and placed with paper chart.

## 2020-11-29 NOTE — ED Triage Notes (Addendum)
BIB EMS from home. Syncopal episode. Fall. Obvious deformity to LLE.  18LAC by EMS. Vitals good for EMS BGL 113 12lead normal.  EMS administered 50 mcg of fentanyl. Patient now 1/10

## 2020-11-29 NOTE — ED Notes (Signed)
Left ankle reduced by EDP, York Cerise. Splint applied by ortho tech/ED staff. Pt tolerated procedure well.

## 2020-11-29 NOTE — Consult Note (Signed)
ORTHOPAEDIC CONSULTATION  REQUESTING PHYSICIAN: Lorretta Harp, MD  Chief Complaint: Left ankle injury status post syncopal episode  HPI: Sarah Higgins is a 86 y.o. female who sustained an injury to her left ankle during a syncopal episode at home today.  Patient was noted to have deformity to the left ankle after her fall.  She has been unable to weight-bear since the injury.  Patient presented to the [facility] emergency department where x-rays revealed a left ankle fracture dislocation.  Patient is being admitted to the hospital service for work-up of her syncope.  Orthopedics is consulted for management of her left ankle fracture.  A closed reduction was attempted by the ED staff.  Post reduction films show the talus remains laterally dislocated.  Past Medical History:  Diagnosis Date  . GERD (gastroesophageal reflux disease)   . HLD (hyperlipidemia)   . Hypertension    Past Surgical History:  Procedure Laterality Date  . ABDOMINAL HYSTERECTOMY     Social History   Socioeconomic History  . Marital status: Married    Spouse name: Not on file  . Number of children: Not on file  . Years of education: Not on file  . Highest education level: Not on file  Occupational History  . Not on file  Tobacco Use  . Smoking status: Never Smoker  . Smokeless tobacco: Never Used  Substance and Sexual Activity  . Alcohol use: Not on file  . Drug use: Not on file  . Sexual activity: Not on file  Other Topics Concern  . Not on file  Social History Narrative  . Not on file   Social Determinants of Health   Financial Resource Strain: Not on file  Food Insecurity: Not on file  Transportation Needs: Not on file  Physical Activity: Not on file  Stress: Not on file  Social Connections: Not on file   Family History  Problem Relation Age of Onset  . Hypertension Mother   . Hypertension Father    No Known Allergies Prior to Admission medications   Medication Sig Start Date End  Date Taking? Authorizing Provider  benzonatate (TESSALON) 100 MG capsule Take 100 mg by mouth 3 (three) times daily as needed for cough.   Yes [provider]  fluticasone (FLONASE) 50 MCG/ACT nasal spray Place 1 spray into both nostrils daily.   Yes [provider]  gabapentin (NEURONTIN) 100 MG capsule Take 200 mg by mouth 3 (three) times daily.   Yes [provider]  latanoprost (XALATAN) 0.005 % ophthalmic solution Place 1 drop into both eyes at bedtime.   Yes [provider]  levocetirizine (XYZAL) 5 MG tablet Take 5 mg by mouth daily.   Yes [provider]  loperamide (IMODIUM) 2 MG capsule Take 4 mg by mouth 2 (two) times daily as needed for diarrhea or loose stools.   Yes [provider]  lovastatin (MEVACOR) 20 MG tablet Take 20 mg by mouth every evening.   Yes [provider]  omeprazole (PRILOSEC) 40 MG capsule Take 40 mg by mouth daily.   Yes [provider]  oxybutynin (DITROPAN-XL) 10 MG 24 hr tablet Take 10 mg by mouth daily.   Yes [provider]  sodium chloride HYPERTONIC 3 % nebulizer solution Take 4 mLs by nebulization 2 (two) times daily.   Yes [provider]  venlafaxine XR (EFFEXOR-XR) 150 MG 24 hr capsule Take 150 mg by mouth daily.   Yes [provider]   DG  Ankle 2 Views Left  Result Date: 11/29/2020 CLINICAL DATA:  Left ankle fracture-dislocation status post reduction EXAM: LEFT ANKLE - 2 VIEW COMPARISON:  Same day radiographs FINDINGS: Persistent ankle dislocation with posterolateral translation of the talus relative to the distal tibia. Slight decreased angulation of distal fibular metaphyseal fracture. Moderately displaced medial malleolar fracture. Probable small posterior malleolar fracture. Diffuse soft tissue swelling. Overlying splint material. IMPRESSION: Trimalleolar left ankle fracture-dislocation with persistent posterolateral subluxation of the talus relative to  the distal tibia. Electronically Signed   By: Duanne Guess D.O.   On: 11/29/2020 17:55   DG Ankle Complete Left  Result Date: 11/29/2020 CLINICAL DATA:  Fall, pain, deformity EXAM: LEFT ANKLE COMPLETE - 3+ VIEW COMPARISON:  None. FINDINGS: There is fracture dislocation at the left ankle. Distal fibular metaphyseal fracture and medial malleolar fracture. Possible posterior malleolar fracture although difficult to determine/visualize. Lateral dislocation of the talus relative to the tibia. IMPRESSION: Left ankle fracture dislocation as above. Electronically Signed   By: Charlett Nose M.D.   On: 11/29/2020 15:30    Positive ROS: All other systems have been reviewed and were otherwise negative with the exception of those mentioned in the HPI and as above.  Physical Exam: General: Alert, no acute distress  MUSCULOSKELETAL: Left lower extremity: Patient has intact skin.  Patient has a valgus deformity of the left ankle.  She has palpable pedal pulses.  She has intact sensation light touch and can flex and extend her toes.  Her leg compartments are soft and compressible.  Assessment: Left closed trimalleolar ankle fracture dislocation  Plan: I reviewed the patient's plain x-rays.  Patient has a persistent lateral dislocation of the talus.  I performed a closed reduction at the patient's bedside this evening.  Follow-up x-rays will be taken.  Patient will require surgical fixation for her unstable trimalleolar ankle fracture.  Patient will be n.p.o. after midnight but surgery is pending medical clearance following work-up of her syncope.  Patient understood and agreed with this plan.  She is to remain nonweightbearing on the left lower extremity and should continue strict elevation of the left lower extremity overnight.   Juanell Fairly, MD    11/29/2020 6:53 PM

## 2020-11-29 NOTE — H&P (Addendum)
History and Physical    TASHIYA SOUDERS TMH:[id number] DOB: Sep 03, 1934 DOA: 11/29/2020  Referring MD/NP/PA:   PCP: Patient, No Pcp Per (Inactive)   Patient coming from:  The patient is coming from home.  At baseline, pt is independent for most of ADL.        Chief Complaint: Syncope fall, left ankle pain  HPI: Sarah Higgins is a 86 y.o. female with medical history significant of hypertension, hyperlipidemia, GERD, CKD-2, who presents with syncope, fall and left ankle pain.  Patient states that she passed out and fell at about 2:30 PM today when she was walking to the bathroom.  She states that she passed out before using bathroom.  She states that she passed out for several minutes.  She did not have seizure activity.  She fell possibly backward, denies head or neck injury.  She injured her left ankle.  She developed severe pain in left ankle, which is constant, sharp, nonradiating.  No leg numbness.  Patient denies unilateral numbness or tingling in extremities.  No facial droop or slurred speech. Patient does not have chest pain, cough, shortness of breath, fever or chills.  No nausea, vomiting, diarrhea or abdominal pain, no symptoms of UTI.  Patient states that she never had CAD, heart attack or congestive heart failure.  No history of COPD or chronic lung disease.   ED Course: pt was found to have WBC 17.2, troponin level 5, negative COVID PCR, creatinine 1.06, BUN 15, GFR 52, temperature normal, soft blood pressure, heart rate 81, RR 23, oxygen saturation 95% on room air.  X-ray of the left ankle showed fracture.  Patient is admitted to MedSurg bed as inpatient  Review of Systems:   General: no fevers, chills, no body weight gain, has fatigue HEENT: no blurry vision, hearing changes or sore throat Respiratory: no dyspnea, coughing, wheezing CV: no chest pain, no palpitations GI: no nausea, vomiting, abdominal pain, diarrhea, constipation GU: no dysuria, burning on urination, increased  urinary frequency, hematuria  Ext: no leg edema Neuro: no unilateral weakness, numbness, or tingling, no vision change or hearing loss.  Has syncope and fall Skin: no rash, no skin tear. MSK: Has left ankle pain Heme: No easy bruising.  Travel history: No recent long distant travel.  Allergy: No Known Allergies  Past Medical History:  Diagnosis Date  . GERD (gastroesophageal reflux disease)   . HLD (hyperlipidemia)   . Hypertension     Past Surgical History:  Procedure Laterality Date  . ABDOMINAL HYSTERECTOMY      Social History:  reports that she has never smoked. She has never used smokeless tobacco. No history on file for alcohol use and drug use.  Family History:  Family History  Problem Relation Age of Onset  . Hypertension Mother   . Hypertension Father      Prior to Admission medications   Not on File    Physical Exam: Vitals:   11/29/20 1600 11/29/20 1620 11/29/20 1630 11/29/20 1814  BP: (!) 94/38 101/67  114/61  Pulse: 81 83 84 72  Resp: (!) 22 20 19 20   Temp:    97.8 F (36.6 C)  TempSrc:      SpO2: 95% 93% 95% 100%  Weight:      Height:       General: Not in acute distress HEENT:       Eyes: PERRL, EOMI, no scleral icterus.       ENT: No discharge from the ears  and nose, no pharynx injection, no tonsillar enlargement.        Neck: No JVD, no bruit, no mass felt. Heme: No neck lymph node enlargement. Cardiac: S1/S2, RRR, 2/6 systolic murmurs, No gallops or rubs. Respiratory: No rales, wheezing, rhonchi or rubs. GI: Soft, nondistended, nontender, no rebound pain, no organomegaly, BS present. GU: No hematuria Ext: No pitting leg edema bilaterally. 1+DP/PT pulse bilaterally. Musculoskeletal: has left ankle tenderness  skin: No rashes.  Neuro: Alert, oriented X3, cranial nerves II-XII grossly intact, moves all extremities. Psych: Patient is not psychotic, no suicidal or hemocidal ideation.  Labs on Admission: I have personally reviewed following  labs and imaging studies  CBC: Recent Labs  Lab 11/29/20 1509  WBC 17.2*  NEUTROABS 10.5*  HGB 12.8  HCT 39.7  MCV 84.3  PLT 236   Basic Metabolic Panel: Recent Labs  Lab 11/29/20 1509  NA 138  K 3.5  CL 102  CO2 28  GLUCOSE 99  BUN 15  CREATININE 1.06*  CALCIUM 9.2  MG 1.6*   GFR: Estimated Creatinine Clearance: 44.5 mL/min (A) (by C-G formula based on SCr of 1.06 mg/dL (H)). Liver Function Tests: Recent Labs  Lab 11/29/20 1509  AST 20  ALT 16  ALKPHOS 83  BILITOT 0.7  PROT 7.7  ALBUMIN 3.8   Recent Labs  Lab 11/29/20 1509  LIPASE 73*   No results for input(s): AMMONIA in the last 168 hours. Coagulation Profile: Recent Labs  Lab 11/29/20 1654  INR 1.1   Cardiac Enzymes: No results for input(s): CKTOTAL, CKMB, CKMBINDEX, TROPONINI in the last 168 hours. BNP (last 3 results) No results for input(s): PROBNP in the last 8760 hours. HbA1C: No results for input(s): HGBA1C in the last 72 hours. CBG: Recent Labs  Lab 11/29/20 1511  GLUCAP 101*   Lipid Profile: No results for input(s): CHOL, HDL, LDLCALC, TRIG, CHOLHDL, LDLDIRECT in the last 72 hours. Thyroid Function Tests: No results for input(s): TSH, T4TOTAL, FREET4, T3FREE, THYROIDAB in the last 72 hours. Anemia Panel: No results for input(s): VITAMINB12, FOLATE, FERRITIN, TIBC, IRON, RETICCTPCT in the last 72 hours. Urine analysis: No results found for: COLORURINE, APPEARANCEUR, LABSPEC, PHURINE, GLUCOSEU, HGBUR, BILIRUBINUR, KETONESUR, PROTEINUR, UROBILINOGEN, NITRITE, LEUKOCYTESUR Sepsis Labs: @LABRCNTIP (procalcitonin:4,lacticidven:4) ) Recent Results (from the past 240 hour(s))  Resp Panel by RT-PCR (Flu A&B, Covid) Nasopharyngeal Swab     Status: None   Collection Time: 11/29/20  3:09 PM   Specimen: Nasopharyngeal Swab; Nasopharyngeal(NP) swabs in vial transport medium  Result Value Ref Range Status   SARS Coronavirus 2 by RT PCR NEGATIVE NEGATIVE Final    Comment: (NOTE) SARS-CoV-2  target nucleic acids are NOT DETECTED.  The SARS-CoV-2 RNA is generally detectable in upper respiratory specimens during the acute phase of infection. The lowest concentration of SARS-CoV-2 viral copies this assay can detect is 138 copies/mL. A negative result does not preclude SARS-Cov-2 infection and should not be used as the sole basis for treatment or other patient management decisions. A negative result may occur with  improper specimen collection/handling, submission of specimen other than nasopharyngeal swab, presence of viral mutation(s) within the areas targeted by this assay, and inadequate number of viral copies(<138 copies/mL). A negative result must be combined with clinical observations, patient history, and epidemiological information. The expected result is Negative.  Fact Sheet for Patients:  12/01/20  Fact Sheet for Healthcare Providers:  BloggerCourse.com  This test is no t yet approved or cleared by the [place] FDA and  has  been authorized for detection and/or diagnosis of SARS-CoV-2 by FDA under an Emergency Use Authorization (EUA). This EUA will remain  in effect (meaning this test can be used) for the duration of the COVID-19 declaration under Section 564(b)(1) of the Act, 21 U.S.C.section 360bbb-3(b)(1), unless the authorization is terminated  or revoked sooner.       Influenza A by PCR NEGATIVE NEGATIVE Final   Influenza B by PCR NEGATIVE NEGATIVE Final    Comment: (NOTE) The Xpert Xpress SARS-CoV-2/FLU/RSV plus assay is intended as an aid in the diagnosis of influenza from Nasopharyngeal swab specimens and should not be used as a sole basis for treatment. Nasal washings and aspirates are unacceptable for Xpert Xpress SARS-CoV-2/FLU/RSV testing.  Fact Sheet for Patients: BloggerCourse.comhttps://www.fda.gov/media/152166/download  Fact Sheet for Healthcare  Providers: SeriousBroker.ithttps://www.fda.gov/media/152162/download  This test is not yet approved or cleared by the [place] FDA and has been authorized for detection and/or diagnosis of SARS-CoV-2 by FDA under an Emergency Use Authorization (EUA). This EUA will remain in effect (meaning this test can be used) for the duration of the COVID-19 declaration under Section 564(b)(1) of the Act, 21 U.S.C. section 360bbb-3(b)(1), unless the authorization is terminated or revoked.  Performed at [facility], [address] [place], [place] [address]      Radiological Exams on Admission: DG Ankle 2 Views Left  Result Date: 11/29/2020 CLINICAL DATA:  Left ankle fracture-dislocation status post reduction EXAM: LEFT ANKLE - 2 VIEW COMPARISON:  Same day radiographs FINDINGS: Persistent ankle dislocation with posterolateral translation of the talus relative to the distal tibia. Slight decreased angulation of distal fibular metaphyseal fracture. Moderately displaced medial malleolar fracture. Probable small posterior malleolar fracture. Diffuse soft tissue swelling. Overlying splint material. IMPRESSION: Trimalleolar left ankle fracture-dislocation with persistent posterolateral subluxation of the talus relative to the distal tibia. Electronically Signed   By: Duanne GuessNicholas  Plundo D.O.   On: 11/29/2020 17:55   DG Ankle Complete Left  Result Date: 11/29/2020 CLINICAL DATA:  Fall, pain, deformity EXAM: LEFT ANKLE COMPLETE - 3+ VIEW COMPARISON:  None. FINDINGS: There is fracture dislocation at the left ankle. Distal fibular metaphyseal fracture and medial malleolar fracture. Possible posterior malleolar fracture although difficult to determine/visualize. Lateral dislocation of the talus relative to the tibia. IMPRESSION: Left ankle fracture dislocation as above. Electronically Signed   By: Charlett NoseKevin  Dover M.D.   On: 11/29/2020 15:30     EKG: I have personally reviewed.  Sinus rhythm, QTC 461, LAE, LAD,  nonspecific T wave change  Assessment/Plan Principal Problem:   Trimalleolar fracture of left ankle Active Problems:   HTN (hypertension)   HLD (hyperlipidemia)   Syncope   Fall   Hypomagnesemia   CKD (chronic kidney disease), stage II   GERD (gastroesophageal reflux disease)   Leukocytosis   Trimalleolar fracture of left ankle: X-ray showed trimalleolar left ankle fracture-dislocation with persistent posterolateral subluxation of the talus relative to the distal tibia. Patient has moderate pain now. No neurovascular compromise. Orthopedic surgeon, Dr. Martha ClanKrasinski was consulted.  Planning to do surgery in the morning  - will admit to Med-surg bed - Pain control: morphine prn and percocet - When necessary Zofran for nausea - Robaxin for muscle spasm - Appreciated Dr. consultation - type and cross - INR/PTT - PT/OT when able to (not ordered now)  Leukocytosis: WBC 17.2.  Likely due to stress-induced demargination. Patient does not have signs of infection. -Follow-up CBC  HTN (hypertension): Blood pressure is soft.  Patient is not taking medications currently -IV  hydralazine as needed  HLD (hyperlipidemia) -Pravastatin  Syncope and fall: Etiology is not clear.The differential diagnosis is broad. Patient denies any chest pain, no shortness of breath, less likely to have ACS or PE.  Troponin negative.  Other differential diagnoses include vasovagal syncope, TIA/stroke, orthostatic status.  Patient has 2/6 systolic murmur on auscultation, will get  2D echo for further evaluation. - Orthostatic vital signs  - MRI-brain - 2d echo - Neuro checks  - IVF as above  Hypomagnesemia: Magnesium 1.6 -Repleted with magnesium sulfate 2 g  CKD (chronic kidney disease), stage II: Slightly worsening than baseline.  Baseline creatinine 0.72 on 06/16/2020.  Her creatinine is 1.06, BUN 15. -IV fluid: 75 cc/h for normal saline  GERD (gastroesophageal reflux  disease) -Protonix    Perioperative Cardiac Risk: pt has multiple comorbidities, including hypertension, hyperlipidemia, CKD stage II, GERD, but the patient does not have history of cardiac issues.  No CAD, myocardial infarction, CHF.  No history of chronic lung disease.  Patient does not have any chest pain, shortness of breath.  No leg edema.  Patient has 2/6 systolic murmur on auscultation, which is chronic issue per patient. Currently patient is active and independent of ADLs and, IADLs.    If patient did not have syncope, she would not need to have further work-up.  But due to syncope, will need to get MRI of brain and 2D echo before we can clear patient for surgery. Currently , patient's GUPTA score perioperative myocardial infarction or cardaic arrest is 0.63-2.44%%.    DVT ppx: SCD Code Status: Full code (I discussed with the patient, she want to be full code) Family Communication:  Yes, patient's husband by phone Disposition Plan:  Anticipate discharge back to previous environment Consults called:  Dr. Martha Clan of Ortho Admission status and Level of care: Med-Surg:   as inpt        Status is: Inpatient  Remains inpatient appropriate because:Inpatient level of care appropriate due to severity of illness   Dispo: The patient is from: Home              Anticipated d/c is to: To be determined              Patient currently is not medically stable to d/c.   Difficult to place patient No          Date of Service 11/29/2020    Lorretta Harp [facility]   If 7PM-7AM, please contact night-coverage [email or web address] 11/29/2020, 6:43 PM

## 2020-11-29 NOTE — ED Notes (Signed)
Xray with pt 

## 2020-11-30 ENCOUNTER — Inpatient Hospital Stay: Payer: Medicare Other

## 2020-11-30 ENCOUNTER — Encounter
Admission: EM | Disposition: A | Discharge status: Skilled Nursing Facility | Payer: Self-pay | Source: Home / Self Care | Attending: Internal Medicine

## 2020-11-30 ENCOUNTER — Encounter: Payer: Self-pay | Admitting: Internal Medicine

## 2020-11-30 ENCOUNTER — Inpatient Hospital Stay: Payer: Medicare Other | Admitting: Anesthesiology

## 2020-11-30 DIAGNOSIS — E876 Hypokalemia: Secondary | ICD-10-CM | POA: Diagnosis not present

## 2020-11-30 DIAGNOSIS — R55 Syncope and collapse: Secondary | ICD-10-CM | POA: Diagnosis not present

## 2020-11-30 DIAGNOSIS — S82852A Displaced trimalleolar fracture of left lower leg, initial encounter for closed fracture: Secondary | ICD-10-CM | POA: Diagnosis not present

## 2020-11-30 HISTORY — PX: ORIF ANKLE FRACTURE: SHX5408

## 2020-11-30 LAB — CBC
HCT: 37.3 % (ref 36.0–46.0)
Hemoglobin: 12 g/dL (ref 12.0–15.0)
MCH: 27.1 pg (ref 26.0–34.0)
MCHC: 32.2 g/dL (ref 30.0–36.0)
MCV: 84.2 fL (ref 80.0–100.0)
Platelets: 182 10*3/uL (ref 150–400)
RBC: 4.43 MIL/uL (ref 3.87–5.11)
RDW: 12.9 % (ref 11.5–15.5)
WBC: 13 10*3/uL — ABNORMAL HIGH (ref 4.0–10.5)
nRBC: 0 % (ref 0.0–0.2)

## 2020-11-30 LAB — BASIC METABOLIC PANEL
Anion gap: 11 (ref 5–15)
BUN: 16 mg/dL (ref 8–23)
CO2: 26 mmol/L (ref 22–32)
Calcium: 9 mg/dL (ref 8.9–10.3)
Chloride: 97 mmol/L — ABNORMAL LOW (ref 98–111)
Creatinine, Ser: 0.78 mg/dL (ref 0.44–1.00)
GFR, Estimated: >60 mL/min (ref >60)
Glucose, Bld: 112 mg/dL — ABNORMAL HIGH (ref 70–99)
Potassium: 3.4 mmol/L — ABNORMAL LOW (ref 3.5–5.1)
Sodium: 134 mmol/L — ABNORMAL LOW (ref 135–145)

## 2020-11-30 SURGERY — OPEN REDUCTION INTERNAL FIXATION (ORIF) ANKLE FRACTURE
Anesthesia: General | Site: Ankle | Laterality: Left

## 2020-11-30 MED ORDER — METHOCARBAMOL 1000 MG/10ML IJ SOLN
500.0000 mg | Freq: Four times a day (QID) | INTRAVENOUS | Status: DC | PRN
  Filled 2020-11-30: qty 5

## 2020-11-30 MED ORDER — FENTANYL CITRATE (PF) 100 MCG/2ML IJ SOLN
25.0000 ug | INTRAMUSCULAR | Status: DC | PRN
  Administered 2020-11-30: 25 ug via INTRAVENOUS

## 2020-11-30 MED ORDER — MUPIROCIN 2 % EX OINT
1.0000 "application " | TOPICAL_OINTMENT | Freq: Two times a day (BID) | CUTANEOUS | Status: DC
  Administered 2020-11-30 – 2020-12-03 (×7): 1 via NASAL
  Filled 2020-11-30: qty 22

## 2020-11-30 MED ORDER — IPRATROPIUM-ALBUTEROL 0.5-2.5 (3) MG/3ML IN SOLN: 3.0000 mL | RESPIRATORY_TRACT | Status: AC

## 2020-11-30 MED ORDER — SEVOFLURANE IN SOLN
RESPIRATORY_TRACT | Status: AC
  Filled 2020-11-30: qty 250

## 2020-11-30 MED ORDER — DEXAMETHASONE SODIUM PHOSPHATE 10 MG/ML IJ SOLN
INTRAMUSCULAR | Status: AC
  Filled 2020-11-30: qty 1

## 2020-11-30 MED ORDER — PHENOL 1.4 % MT LIQD
1.0000 | OROMUCOSAL | Status: DC | PRN
  Filled 2020-11-30: qty 177

## 2020-11-30 MED ORDER — BUPIVACAINE HCL 0.25 % IJ SOLN
INTRAMUSCULAR | Status: DC | PRN
  Administered 2020-11-30: 30 mL

## 2020-11-30 MED ORDER — DOCUSATE SODIUM 100 MG PO CAPS
100.0000 mg | ORAL_CAPSULE | Freq: Two times a day (BID) | ORAL | Status: DC
  Administered 2020-11-30 – 2020-12-03 (×6): 100 mg via ORAL
  Filled 2020-11-30 (×6): qty 1

## 2020-11-30 MED ORDER — ONDANSETRON HCL 4 MG/2ML IJ SOLN: 4.0000 mg | Freq: Once | INTRAMUSCULAR | Status: DC | PRN

## 2020-11-30 MED ORDER — HYDROCODONE-ACETAMINOPHEN 5-325 MG PO TABS
1.0000 | ORAL_TABLET | ORAL | Status: DC | PRN
  Administered 2020-11-30: 2 via ORAL
  Administered 2020-12-01 – 2020-12-03 (×5): 1 via ORAL
  Filled 2020-11-30 (×2): qty 1
  Filled 2020-11-30: qty 2
  Filled 2020-11-30 (×3): qty 1

## 2020-11-30 MED ORDER — LIDOCAINE HCL (CARDIAC) PF 100 MG/5ML IV SOSY
PREFILLED_SYRINGE | INTRAVENOUS | Status: DC | PRN
  Administered 2020-11-30: 60 mg via INTRAVENOUS

## 2020-11-30 MED ORDER — FENTANYL CITRATE (PF) 100 MCG/2ML IJ SOLN
INTRAMUSCULAR | Status: DC | PRN
  Administered 2020-11-30 (×4): 25 ug via INTRAVENOUS

## 2020-11-30 MED ORDER — ONDANSETRON HCL 4 MG PO TABS
4.0000 mg | ORAL_TABLET | Freq: Four times a day (QID) | ORAL | Status: DC | PRN
  Administered 2020-12-02: 4 mg via ORAL
  Filled 2020-11-30: qty 1

## 2020-11-30 MED ORDER — ENOXAPARIN SODIUM 40 MG/0.4ML IJ SOSY
40.0000 mg | PREFILLED_SYRINGE | INTRAMUSCULAR | Status: DC
  Administered 2020-12-01 – 2020-12-03 (×3): 40 mg via SUBCUTANEOUS
  Filled 2020-11-30 (×3): qty 0.4

## 2020-11-30 MED ORDER — MAGNESIUM CITRATE PO SOLN
1.0000 | Freq: Once | ORAL | Status: DC | PRN
  Filled 2020-11-30: qty 296

## 2020-11-30 MED ORDER — SENNA 8.6 MG PO TABS
1.0000 | ORAL_TABLET | Freq: Two times a day (BID) | ORAL | Status: DC
  Administered 2020-11-30 – 2020-12-03 (×7): 8.6 mg via ORAL
  Filled 2020-11-30 (×6): qty 1

## 2020-11-30 MED ORDER — FENTANYL CITRATE (PF) 100 MCG/2ML IJ SOLN
INTRAMUSCULAR | Status: AC
  Filled 2020-11-30: qty 2

## 2020-11-30 MED ORDER — IPRATROPIUM-ALBUTEROL 0.5-2.5 (3) MG/3ML IN SOLN
RESPIRATORY_TRACT | Status: AC
  Administered 2020-11-30: 3 mL via RESPIRATORY_TRACT
  Filled 2020-11-30: qty 3

## 2020-11-30 MED ORDER — FENTANYL CITRATE (PF) 100 MCG/2ML IJ SOLN
INTRAMUSCULAR | Status: AC
  Administered 2020-11-30: 25 ug via INTRAVENOUS
  Filled 2020-11-30: qty 2

## 2020-11-30 MED ORDER — LIDOCAINE HCL (PF) 2 % IJ SOLN
INTRAMUSCULAR | Status: AC
  Filled 2020-11-30: qty 4

## 2020-11-30 MED ORDER — PROPOFOL 10 MG/ML IV BOLUS
INTRAVENOUS | Status: AC
  Filled 2020-11-30: qty 20

## 2020-11-30 MED ORDER — CEFAZOLIN SODIUM-DEXTROSE 2-4 GM/100ML-% IV SOLN
2.0000 g | Freq: Four times a day (QID) | INTRAVENOUS | Status: AC
  Administered 2020-11-30 – 2020-12-01 (×2): 2 g via INTRAVENOUS
  Filled 2020-11-30 (×2): qty 100

## 2020-11-30 MED ORDER — DEXAMETHASONE SODIUM PHOSPHATE 10 MG/ML IJ SOLN
INTRAMUSCULAR | Status: DC | PRN
  Administered 2020-11-30: 5 mg via INTRAVENOUS

## 2020-11-30 MED ORDER — TRAMADOL HCL 50 MG PO TABS
50.0000 mg | ORAL_TABLET | Freq: Four times a day (QID) | ORAL | Status: DC
  Administered 2020-11-30 – 2020-12-03 (×10): 50 mg via ORAL
  Filled 2020-11-30 (×10): qty 1

## 2020-11-30 MED ORDER — ONDANSETRON HCL 4 MG/2ML IJ SOLN
INTRAMUSCULAR | Status: DC | PRN
  Administered 2020-11-30: 4 mg via INTRAVENOUS

## 2020-11-30 MED ORDER — BISACODYL 10 MG RE SUPP: 10.0000 mg | Freq: Every day | RECTAL | Status: DC | PRN

## 2020-11-30 MED ORDER — CHLORHEXIDINE GLUCONATE CLOTH 2 % EX PADS
6.0000 | MEDICATED_PAD | Freq: Every day | CUTANEOUS | Status: DC
  Administered 2020-11-30 – 2020-12-02 (×3): 6 via TOPICAL

## 2020-11-30 MED ORDER — ONDANSETRON HCL 4 MG/2ML IJ SOLN
INTRAMUSCULAR | Status: AC
  Filled 2020-11-30: qty 2

## 2020-11-30 MED ORDER — METHOCARBAMOL 500 MG PO TABS
500.0000 mg | ORAL_TABLET | Freq: Four times a day (QID) | ORAL | Status: DC | PRN
  Administered 2020-11-30 – 2020-12-02 (×3): 500 mg via ORAL
  Filled 2020-11-30 (×5): qty 1

## 2020-11-30 MED ORDER — ALUM & MAG HYDROXIDE-SIMETH 200-200-20 MG/5ML PO SUSP: 30.0000 mL | ORAL | Status: DC | PRN

## 2020-11-30 MED ORDER — MENTHOL 3 MG MT LOZG
1.0000 | LOZENGE | OROMUCOSAL | Status: DC | PRN
  Filled 2020-11-30: qty 9

## 2020-11-30 MED ORDER — NEOMYCIN-POLYMYXIN B GU 40-200000 IR SOLN
Status: DC | PRN
  Administered 2020-11-30: 4 mL

## 2020-11-30 MED ORDER — MORPHINE SULFATE (PF) 2 MG/ML IV SOLN: 0.5000 mg | INTRAVENOUS | Status: DC | PRN

## 2020-11-30 MED ORDER — PHENYLEPHRINE HCL (PRESSORS) 10 MG/ML IV SOLN
INTRAVENOUS | Status: DC | PRN
  Administered 2020-11-30 (×4): 50 ug via INTRAVENOUS

## 2020-11-30 MED ORDER — POTASSIUM CHLORIDE 10 MEQ/100ML IV SOLN
10.0000 meq | Freq: Once | INTRAVENOUS | Status: AC
  Administered 2020-11-30: 10 meq via INTRAVENOUS
  Filled 2020-11-30: qty 100

## 2020-11-30 MED ORDER — ONDANSETRON HCL 4 MG/2ML IJ SOLN: 4.0000 mg | Freq: Four times a day (QID) | INTRAMUSCULAR | Status: DC | PRN

## 2020-11-30 MED ORDER — PROPOFOL 10 MG/ML IV BOLUS
INTRAVENOUS | Status: DC | PRN
  Administered 2020-11-30: 100 mg via INTRAVENOUS
  Administered 2020-11-30: 30 mg via INTRAVENOUS

## 2020-11-30 MED ORDER — POLYETHYLENE GLYCOL 3350 17 G PO PACK
17.0000 g | PACK | Freq: Every day | ORAL | Status: DC | PRN
  Administered 2020-12-02: 17 g via ORAL
  Filled 2020-11-30: qty 1

## 2020-11-30 SURGICAL SUPPLY — 65 items
BIT DRILL 2.5 X LONG (BIT) ×1
BIT DRILL LONG 2.7 (BIT) ×1 IMPLANT
BIT DRILL X LONG 2.5 (BIT) ×1 IMPLANT
BLADE SURG 15 STRL LF DISP TIS (BLADE) ×1 IMPLANT
BLADE SURG 15 STRL SS (BLADE) ×2
BNDG CMPR STD VLCR NS LF 5.8X4 (GAUZE/BANDAGES/DRESSINGS) ×2
BNDG COHESIVE 4X5 TAN STRL (GAUZE/BANDAGES/DRESSINGS) ×4 IMPLANT
BNDG ELASTIC 4X5.8 VLCR NS LF (GAUZE/BANDAGES/DRESSINGS) ×4 IMPLANT
BNDG ELASTIC 6X5.8 VLCR STR LF (GAUZE/BANDAGES/DRESSINGS) ×2 IMPLANT
BNDG ESMARK 6X12 TAN STRL LF (GAUZE/BANDAGES/DRESSINGS) ×2 IMPLANT
COVER WAND RF STERILE (DRAPES) ×2 IMPLANT
CUFF TOURN SGL QUICK 24 (TOURNIQUET CUFF) ×2
CUFF TOURN SGL QUICK 34 (TOURNIQUET CUFF)
CUFF TRNQT CYL 24X4X16.5-23 (TOURNIQUET CUFF) ×1 IMPLANT
CUFF TRNQT CYL 34X4.125X (TOURNIQUET CUFF) IMPLANT
DRAPE FLUOR MINI C-ARM 54X84 (DRAPES) ×2 IMPLANT
DRAPE INCISE IOBAN 66X45 STRL (DRAPES) ×2 IMPLANT
DRAPE U-SHAPE 47X51 STRL (DRAPES) ×2 IMPLANT
DRILL BIT LONG 2.7 (BIT) ×2
DRILL BIT X LONG 2.5 (BIT) ×2
DURAPREP 26ML APPLICATOR (WOUND CARE) ×4 IMPLANT
ELECT CAUTERY BLADE 6.4 (BLADE) ×2 IMPLANT
ELECT REM PT RETURN 9FT ADLT (ELECTROSURGICAL) ×2
ELECTRODE REM PT RTRN 9FT ADLT (ELECTROSURGICAL) ×1 IMPLANT
GAUZE SPONGE 4X4 12PLY STRL (GAUZE/BANDAGES/DRESSINGS) ×6 IMPLANT
GAUZE XEROFORM 1X8 LF (GAUZE/BANDAGES/DRESSINGS) ×2 IMPLANT
GLOVE SURG 9.0 ORTHO LTXF (GLOVE) ×4 IMPLANT
GLOVE SURG UNDER POLY LF SZ9 (GLOVE) ×2 IMPLANT
GOWN STRL REUS TWL 2XL XL LVL4 (GOWN DISPOSABLE) ×2 IMPLANT
GOWN STRL REUS W/ TWL LRG LVL3 (GOWN DISPOSABLE) ×1 IMPLANT
GOWN STRL REUS W/TWL LRG LVL3 (GOWN DISPOSABLE) ×2
GUIDEWARE NON THREAD 1.25X150 (WIRE) ×4
GUIDEWIRE NON THREAD 1.25X150 (WIRE) ×2 IMPLANT
HANDLE YANKAUER SUCT BULB TIP (MISCELLANEOUS) ×2 IMPLANT
KIT TURNOVER KIT A (KITS) ×2 IMPLANT
LABEL OR SOLS (LABEL) ×2 IMPLANT
MANIFOLD NEPTUNE II (INSTRUMENTS) ×2 IMPLANT
NS IRRIG 1000ML POUR BTL (IV SOLUTION) ×2 IMPLANT
PACK EXTREMITY ARMC (MISCELLANEOUS) ×2 IMPLANT
PAD ABD DERMACEA PRESS 5X9 (GAUZE/BANDAGES/DRESSINGS) ×12 IMPLANT
PAD CAST CTTN 4X4 STRL (SOFTGOODS) ×7 IMPLANT
PADDING CAST COTTON 4X4 STRL (SOFTGOODS) ×14
PLATE LCP 3.5 1/3 TUB 8HX93 (Plate) ×2 IMPLANT
SCREW CANC FT ST SFS 4X16 (Screw) ×2 IMPLANT
SCREW CANC FT/18 4.0 (Screw) ×2 IMPLANT
SCREW CANN L THRD/48 4.0 (Screw) ×2 IMPLANT
SCREW CANN L THRD/50 4.0 (Screw) ×2 IMPLANT
SCREW CORTEX 3.5 12MM (Screw) ×1 IMPLANT
SCREW CORTEX 3.5 14MM (Screw) ×5 IMPLANT
SCREW CORTEX 3.5 16MM (Screw) ×1 IMPLANT
SCREW CORTEX 3.5 24MM (Screw) ×1 IMPLANT
SCREW LOCK CORT ST 3.5X12 (Screw) ×1 IMPLANT
SCREW LOCK CORT ST 3.5X14 (Screw) ×5 IMPLANT
SCREW LOCK CORT ST 3.5X16 (Screw) ×1 IMPLANT
SCREW LOCK CORT ST 3.5X24 (Screw) ×1 IMPLANT
SPLINT CAST 1 STEP 4X30 (MISCELLANEOUS) ×4 IMPLANT
SPONGE LAP 18X18 RF (DISPOSABLE) ×2 IMPLANT
STAPLER SKIN PROX 35W (STAPLE) ×2 IMPLANT
STOCKINETTE STRL 6IN 960660 (GAUZE/BANDAGES/DRESSINGS) ×2 IMPLANT
STRIP CLOSURE SKIN 1/2X4 (GAUZE/BANDAGES/DRESSINGS) ×4 IMPLANT
SUT VIC AB 2-0 SH 27 (SUTURE) ×4
SUT VIC AB 2-0 SH 27XBRD (SUTURE) ×2 IMPLANT
SYR 30ML LL (SYRINGE) ×2 IMPLANT
TAPE PAPER 2X10 WHT MICROPORE (GAUZE/BANDAGES/DRESSINGS) ×2 IMPLANT
WASHER 7MM DIA (Washer) ×4 IMPLANT

## 2020-11-30 NOTE — Progress Notes (Signed)
PACU:  While applying TED hose to nonsurgical foot/ankle (right), pt noted to have severe pain/tenderness and ankle swelling. Right ankle xray ordered per Dr. Martha Clan.

## 2020-11-30 NOTE — Progress Notes (Signed)
Subjective:  Postop check.  Patient status post left ankle ORIF.  In the PACU patient was found to have right ankle pain and swelling when RN attempted to place TED stockings.  Right ankle x-rays were taken which demonstrate a nondisplaced right lateral malleolus fracture.  Patient had an episode of vomiting during extubation.  A chest x-ray does not show definitive evidence of an aspiration.  Patient is not in any respiratory distress.  Patient has maintained good oxygen saturations.  Objective:   VITALS:   Vitals:   11/30/20 1545 11/30/20 1555 11/30/20 1617 11/30/20 1645  BP: 101/72  (!) 116/95 105/63  Pulse: 79 78 78 78  Resp: 18 17 18 17   Temp:      TempSrc:      SpO2: 100% 100% 100% 97%  Weight:      Height:        PHYSICAL EXAM: Left lower extremity: Patient's splint over the left ankle is clean dry and intact.  Patient can flex and extend her toes and her toes well perfused.  Right lower extremity: Patient skin is intact.  She has swelling focally over the lateral malleolus with tenderness in this area.  Otherwise she is neurovascular intact with intact motor function.   LABS  Results for orders placed or performed during the hospital encounter of 11/29/20 (from the past 24 hour(s))  Surgical pcr screen     Status: Abnormal   Collection Time: 11/29/20  6:51 PM   Specimen: Nasal Mucosa; Nasal Swab  Result Value Ref Range   MRSA, PCR POSITIVE (A) NEGATIVE   Staphylococcus aureus POSITIVE (A) NEGATIVE  CBC     Status: Abnormal   Collection Time: 11/30/20  5:27 AM  Result Value Ref Range   WBC 13.0 (H) 4.0 - 10.5 K/uL   RBC 4.43 3.87 - 5.11 MIL/uL   Hemoglobin 12.0 12.0 - 15.0 g/dL   HCT 12/02/20 32.2 - 02.5 %   MCV 84.2 80.0 - 100.0 fL   MCH 27.1 26.0 - 34.0 pg   MCHC 32.2 30.0 - 36.0 g/dL   RDW 42.7 06.2 - 37.6 %   Platelets 182 150 - 400 K/uL   nRBC 0.0 0.0 - 0.2 %  Basic metabolic panel     Status: Abnormal   Collection Time: 11/30/20  5:27 AM  Result Value Ref  Range   Sodium 134 (L) 135 - 145 mmol/L   Potassium 3.4 (L) 3.5 - 5.1 mmol/L   Chloride 97 (L) 98 - 111 mmol/L   CO2 26 22 - 32 mmol/L   Glucose, Bld 112 (H) 70 - 99 mg/dL   BUN 16 8 - 23 mg/dL   Creatinine, Ser 12/02/20 0.44 - 1.00 mg/dL   Calcium 9.0 8.9 - 1.51 mg/dL   GFR, Estimated 76.1 >60 mL/min   Anion gap 11 5 - 15    X-ray chest PA or AP  Result Date: 11/30/2020 CLINICAL DATA:  Vomiting.  Assess for aspiration EXAM: CHEST  1 VIEW COMPARISON:  None. FINDINGS: Airspace disease seen in the right upper lobe, most predominantly platelike. Left base atelectasis. Heart is normal size. No effusions. IMPRESSION: Airspace disease in the right upper lobe predominantly is platelike suggesting atelectasis although airspace disease elsewhere in the right upper lobe could reflect infiltrate. Left base atelectasis. Electronically Signed   By: 12/02/2020 M.D.   On: 11/30/2020 13:34   DG Ankle 2 Views Left  Result Date: 11/29/2020 CLINICAL DATA:  Left ankle fracture-dislocation status post reduction  EXAM: LEFT ANKLE - 2 VIEW COMPARISON:  Same day radiographs FINDINGS: Persistent ankle dislocation with posterolateral translation of the talus relative to the distal tibia. Slight decreased angulation of distal fibular metaphyseal fracture. Moderately displaced medial malleolar fracture. Probable small posterior malleolar fracture. Diffuse soft tissue swelling. Overlying splint material. IMPRESSION: Trimalleolar left ankle fracture-dislocation with persistent posterolateral subluxation of the talus relative to the distal tibia. Electronically Signed   By: Duanne Guess D.O.   On: 11/29/2020 17:55   DG Ankle Complete Left  Result Date: 11/30/2020 CLINICAL DATA:  ORIF EXAM: LEFT ANKLE COMPLETE - 3+ VIEW COMPARISON:  None. FINDINGS: Plate and screw fixation within the distal fibula. Screw seen within the medial malleolus. Anatomic alignment. No hardware complicating feature. IMPRESSION: Internal fixation.  No  visible complicating feature. Electronically Signed   By: Charlett Nose M.D.   On: 11/30/2020 14:44   DG Ankle Complete Left  Result Date: 11/29/2020 CLINICAL DATA:  Fall, pain, deformity EXAM: LEFT ANKLE COMPLETE - 3+ VIEW COMPARISON:  None. FINDINGS: There is fracture dislocation at the left ankle. Distal fibular metaphyseal fracture and medial malleolar fracture. Possible posterior malleolar fracture although difficult to determine/visualize. Lateral dislocation of the talus relative to the tibia. IMPRESSION: Left ankle fracture dislocation as above. Electronically Signed   By: Charlett Nose M.D.   On: 11/29/2020 15:30   X-ray ankle right AP lateral and oblique  Result Date: 11/30/2020 CLINICAL DATA:  Acute right ankle pain and swelling after fall yesterday. EXAM: RIGHT ANKLE - COMPLETE 3+ VIEW COMPARISON:  None. FINDINGS: Nondisplaced oblique fracture is seen involving the distal right fibula. Soft tissue swelling is seen over the lateral malleolus. Joint spaces are intact. IMPRESSION: Nondisplaced distal right fibular fracture. Electronically Signed   By: Lupita Raider M.D.   On: 11/30/2020 15:07   MR BRAIN WO CONTRAST  Result Date: 11/29/2020 CLINICAL DATA:  Syncope EXAM: MRI HEAD WITHOUT CONTRAST TECHNIQUE: Multiplanar, multiecho pulse sequences of the brain and surrounding structures were obtained without intravenous contrast. COMPARISON:  None. FINDINGS: Brain: No acute infarct, mass effect or extra-axial collection. No acute or chronic hemorrhage. Hyperintense T2-weighted signal is moderately widespread throughout the white matter. Generalized volume loss without a clear lobar predilection. The midline structures are normal. Vascular: Major flow voids are preserved. Skull and upper cervical spine: Normal calvarium and skull base. Visualized upper cervical spine and soft tissues are normal. Sinuses/Orbits:Diffuse paranasal sinus mucosal thickening. Normal orbits. IMPRESSION: 1. No acute  intracranial abnormality. 2. Generalized volume loss and findings of chronic small vessel ischemia. 3. Diffuse paranasal sinus mucosal thickening. Electronically Signed   By: Deatra Robinson M.D.   On: 11/29/2020 21:45   DG MINI C-ARM IMAGE ONLY  Result Date: 11/30/2020 There is no interpretation for this exam.  This order is for images obtained during a surgical procedure.  Please See "Surgeries" Tab for more information regarding the procedure.    Assessment/Plan: Day of Surgery   Principal Problem:   Trimalleolar fracture of left ankle Active Problems:   HTN (hypertension)   HLD (hyperlipidemia)   Syncope   Fall   Hypomagnesemia   CKD (chronic kidney disease), stage II   GERD (gastroesophageal reflux disease)   Leukocytosis   Hypokalemia  Patient doing well postop.  A splint was placed on the right ankle to prevent displacement of the lateral malleolus fracture.  I reviewed her postop x-rays which demonstrate the left ankle is anatomically reduced.  The hardware is well-positioned and there is no evidence  of complications.  Patient will continue to elevate and ice both ankles.  She is going to be nonweightbearing on both lower extremities and will be bed to chair transfer.  Patient will require a wheelchair with bilateral leg supports.    Juanell Fairly , MD 11/30/2020, 5:46 PM

## 2020-11-30 NOTE — Plan of Care (Signed)

## 2020-11-30 NOTE — Anesthesia Preprocedure Evaluation (Signed)
Anesthesia Evaluation  Patient identified by MRN, date of birth, ID band Patient awake    Reviewed: Allergy & Precautions, H&P , NPO status , Patient's Chart, lab work & pertinent test results, reviewed documented beta blocker date and time   History of Anesthesia Complications Negative for: history of anesthetic complications  Airway Mallampati: II  TM Distance: >3 FB Neck ROM: full    Dental no notable dental hx. (+) Edentulous Upper, Dental Advidsory Given, Missing, Poor Dentition   Pulmonary neg pulmonary ROS,    Pulmonary exam normal breath sounds clear to auscultation       Cardiovascular Exercise Tolerance: Good hypertension, (-) angina(-) Past MI and (-) Cardiac Stents Normal cardiovascular exam(-) dysrhythmias + Valvular Problems/Murmurs  Rhythm:regular Rate:Normal     Neuro/Psych negative neurological ROS  negative psych ROS   GI/Hepatic Neg liver ROS, GERD  ,  Endo/Other  negative endocrine ROS  Renal/GU CRFRenal disease  negative genitourinary   Musculoskeletal   Abdominal   Peds  Hematology negative hematology ROS (+)   Anesthesia Other Findings Past Medical History: No date: GERD (gastroesophageal reflux disease) No date: HLD (hyperlipidemia) No date: Hypertension   Reproductive/Obstetrics negative OB ROS                             Anesthesia Physical Anesthesia Plan  ASA: III  Anesthesia Plan: General   Post-op Pain Management:    Induction: Intravenous  PONV Risk Score and Plan: 3 and Ondansetron, Dexamethasone and Treatment may vary due to age or medical condition  Airway Management Planned: Oral ETT and LMA  Additional Equipment:   Intra-op Plan:   Post-operative Plan: Extubation in OR  Informed Consent: I have reviewed the patients History and Physical, chart, labs and discussed the procedure including the risks, benefits and alternatives for the  proposed anesthesia with the patient or authorized representative who has indicated his/her understanding and acceptance.     Dental Advisory Given  Plan Discussed with: Anesthesiologist, CRNA and Surgeon  Anesthesia Plan Comments:         Anesthesia Quick Evaluation

## 2020-11-30 NOTE — Anesthesia Procedure Notes (Signed)
Procedure Name: LMA Insertion Date/Time: 11/30/2020 11:11 AM Performed by: Elmarie Mainland, CRNA Pre-anesthesia Checklist: Patient identified, Emergency Drugs available, Suction available and Patient being monitored Patient Re-evaluated:Patient Re-evaluated prior to induction Oxygen Delivery Method: Circle system utilized Preoxygenation: Pre-oxygenation with 100% oxygen Induction Type: IV induction Ventilation: Mask ventilation without difficulty LMA: LMA inserted LMA Size: 3.5 Number of attempts: 1 Placement Confirmation: positive ETCO2 and breath sounds checked- equal and bilateral Tube secured with: Tape Dental Injury: Teeth and Oropharynx as per pre-operative assessment

## 2020-11-30 NOTE — Op Note (Signed)
11/30/2020  1:21 PM  PATIENT:  Sarah Higgins    PRE-OPERATIVE DIAGNOSIS:  Left trimalleolar ankle fracture dislocation  POST-OPERATIVE DIAGNOSIS:  Same  PROCEDURE:  OPEN REDUCTION INTERNAL FIXATION (ORIF) OF LEFT TRIMALLEOLAR ANKLE FRACTURE  SURGEON:  Thornton Park, MD  ANESTHESIA:   General  PREOPERATIVE INDICATIONS:  Sarah Higgins is a  86 y.o. female with a diagnosis of Left Ankle Fracture who with a displaced trimalleolar ankle fracture dislocation.  Patient was closed reduced and splinted yesterday.  Given the displacement and instability of her left ankle, it was recommended the patient undergo open reduction internal fixation of her fracture.  I discussed the risks and benefits of surgery. The risks include but are not limited to infection, bleeding requiring blood transfusion, nerve or blood vessel injury, joint stiffness or loss of motion, persistent pain, weakness or instability, malunion, nonunion and hardware failure and the need for further surgery. Medical risks include but are not limited to DVT and pulmonary embolism, myocardial infarction, stroke, pneumonia, respiratory failure and death. Patient understood these risks and wished to proceed.   OPERATIVE IMPLANTS: Synthes 8 hole one third tubular plate for lateral malleolus fixation and 4.0 cannulated screws x2 for medial malleolus fixation.  OPERATIVE FINDINGS: Unstable trimalleolar ankle fracture  OPERATIVE PROCEDURE:   Patient was met in the preoperative area. The left leg was signed my initials and the word yes according the hospital's correct site of surgery protocol.  The patient was brought to the operating room where she underwent general anesthesia with LMA. The patient was placed supine on the operative table. A bump was placed under the left hip. A tourniquet was applied to the left thigh.  The lower extremity was prepped and draped in a sterile fashion. A timeout was performed to verify the patient's name, date of  birth, medical record number, correct site of surgery and correct procedure to be performed. It was also used to verify the patient received antibiotics, and that all appropriate instruments, implants and radiographic studies were available in the room. Once all in attendance were in agreement, the case began.  The left lower extremity was exsanguinated with an Esmarch. The tourniquet was inflated to 275 mmHg. This was applied for a total of 75 minutes. A lateral incision was made over the fibula. The subcutaneous tissues were dissected with the Metzenbaum scissor and pickup. Care was taken to avoid injury to the superficial peroneal nerve. The lateral malleolus fracture was identified and irrigated and fracture hematoma was removed. Soft tissue was removed from the fracture site using a periosteal elevator. A fracture reduction clamp was then used to reduce the fracture to an anatomic position.     The lateral malleolus was then drilled in an AP direction, perpendicular to the fracture site to allow for placement of the lag screw.   A single lag screw, 24 mm in length, was advanced across the fracture site by hand. This compressed the fracture.   An 8 hole, 1/3 tubular plate was then contoured and placed along the lateral fibula. Bicortical screws were placed proximal to the fracture and fully threaded cancellus screws were placed distal the fracture. The fracture reduction and hardware placement were confirmed on AP and lateral imaging.  Once the lateral malleolus was plated, the attention was turned to the medial ankle. A small vertical incision was made over the tip of the medial malleolus.  Soft tissue was dissected with some with the Metzenbaum scissor and pickup. The fracture of the medial  malleolus was identified. This was reduced with a dental pick. 2 threaded K wires for the 4.0 cannulated screws were then advanced through the tip of the medial malleolus across the fracture site and into the distal  tibia. The position of the K wires was evaluated on AP and lateral FluoroScan images. The length of the wires were measured with a depth gauge and were determined to be 50 and 48 mm in length. The wires were then overdrilled with a cannulated drill for the 4.0 cannulated screws. The two long threaded 4.0 cannulated screws with washers were then advanced into position by hand, compressing the medial malleolus fracture.    The posterior malleolus was then examined under fluoroscopy. It was felt to be less than 20% of the articular surface and was in a near anatomic position. The decision was made made not to place an AP screw given its stability and small size.  A stress test of the right ankle was then performed under fluoroscopy.  This test did not reveal any syndesmotic injury or opening of the medial clear space.  The medial and lateral incisions were then copiously irrigated. The subcutaneous tissue was closed with 2-0 Vicryl and the skin approximated staples. A dry sterile dressing was applied along with an AO splint. The patient's ankle was positioned in neutral. The pateint was then awoken from anesthesia, transferred to hospital bed and brought to the PACU in stable condition.  The patient vomited during extubation.  It was suspected the patient had delayed gastric emptying spite being n.p.o. after midnight.  There was no clinical evidence for aspiration per the anesthesiologist. I was scrubbed and present the entire case and all sharp and instrument counts were correct at conclusion the case. I spoke to the patient's husband by phone from the PACU to let him know the ankle was fixed successfully and the patient was stable in recovery room.    Timoteo Gaul, MD

## 2020-11-30 NOTE — Progress Notes (Addendum)
PROGRESS NOTE    Sarah Higgins  EHM:[id number] DOB: 1934-09-01 DOA: 11/29/2020 PCP: Patient, No Pcp Per (Inactive)   Chief complaint.  Syncope Brief Narrative:  Sarah Higgins is a 86 y.o. female with medical history significant of hypertension, hyperlipidemia, GERD, CKD-2, who presents with syncope, fall and left ankle pain.  X-ray showed left ankle fracture.  EKG sinus, no significant prolongation of QTc.  Troponin negative.   Assessment & Plan:   Principal Problem:   Trimalleolar fracture of left ankle Active Problems:   HTN (hypertension)   HLD (hyperlipidemia)   Syncope   Fall   Hypomagnesemia   CKD (chronic kidney disease), stage II   GERD (gastroesophageal reflux disease)   Leukocytosis  #1.  Syncope and collapse. Etiology most likely due to vasovagal reaction.  I personally reviewed the patient EKG, normal QTC elongation, no ST-T changes.  Heart has no murmurs.  Lower probability of valvular disease. Patient has anechocardiogram scheduled, we will follow-up with results, does not need waiting for echo results before surgery. Patient has no history of congestive heart failure, BNP normal.  No chest pain, troponin normal. MRI brain was normal. Patient is cleared to go to surgery due to the emergent nature of procedure.  #2.  Left ankle fracture. Patient is going to surgery today.  No need for additional cardiac work-up, except echocardiogram which can be performed postop.  #3.  Hypomagnesemia. Improved.  #4.  Hypokalemia. Repleted.  DVT prophylaxis: SCDs, start Lovenox postop. Code Status: Full Family Communication:  Disposition Plan:  .   Status is: Inpatient  Remains inpatient appropriate because:Inpatient level of care appropriate due to severity of illness   Dispo: The patient is from: Home              Anticipated d/c is to: Pending.              Patient currently is not medically stable to d/c.   Difficult to place patient No        I/O last 3  completed shifts: In: 698.8 [I.V.:698.8] Out: 125 [Urine:125] No intake/output data recorded.     Consultants:   Orthopedic surgery.  Procedures:   Antimicrobials: None  Subjective: Patient doing well, ankle pain under control.  Denies any chest pain short of breath.  She slept well overnight.  No paroxysmal nocturnal dyspnea. Denies any abdominal pain or nausea vomiting. Nausea fever or chills. No dysuria hematuria   Objective: Vitals:   11/29/20 1630 11/29/20 1814 11/30/20 0502 11/30/20 0840  BP:  114/61 (!) 92/59 (!) 111/50  Pulse: 84 72 69 71  Resp: 19 20 18  (!) 22  Temp:  97.8 F (36.6 C) 98 F (36.7 C) 97.6 F (36.4 C)  TempSrc:      SpO2: 95% 100% 97% 100%  Weight:      Height:        Intake/Output Summary (Last 24 hours) at 11/30/2020 0932 Last data filed at 11/30/2020 0616 Gross per 24 hour  Intake 698.75 ml  Output 125 ml  Net 573.75 ml   Filed Weights   11/29/20 1507  Weight: 86 kg    Examination:  General exam: Appears calm and comfortable  Respiratory system: Clear to auscultation. Respiratory effort normal. Cardiovascular system: S1 & S2 heard, RRR. No JVD, murmurs, rubs, gallops or clicks. No pedal edema. Gastrointestinal system: Abdomen is nondistended, soft and nontender. No organomegaly or masses felt. Normal bowel sounds heard. Central nervous system: Alert and oriented x3. No focal  neurological deficits. Extremities: Symmetric 5 x 5 power. Skin: No rashes, lesions or ulcers Psychiatry: Judgement and insight appear normal. Mood & affect appropriate.     Data Reviewed: I have personally reviewed following labs and imaging studies  CBC: Recent Labs  Lab 11/29/20 1509 11/30/20 0527  WBC 17.2* 13.0*  NEUTROABS 10.5*  --   HGB 12.8 12.0  HCT 39.7 37.3  MCV 84.3 84.2  PLT 236 182   Basic Metabolic Panel: Recent Labs  Lab 11/29/20 1509 11/30/20 0527  NA 138 134*  K 3.5 3.4*  CL 102 97*  CO2 28 26  GLUCOSE 99 112*  BUN 15  16  CREATININE 1.06* 0.78  CALCIUM 9.2 9.0  MG 1.6*  --    GFR: Estimated Creatinine Clearance: 59 mL/min (by C-G formula based on SCr of 0.78 mg/dL). Liver Function Tests: Recent Labs  Lab 11/29/20 1509  AST 20  ALT 16  ALKPHOS 83  BILITOT 0.7  PROT 7.7  ALBUMIN 3.8   Recent Labs  Lab 11/29/20 1509  LIPASE 73*   No results for input(s): AMMONIA in the last 168 hours. Coagulation Profile: Recent Labs  Lab 11/29/20 1654  INR 1.1   Cardiac Enzymes: No results for input(s): CKTOTAL, CKMB, CKMBINDEX, TROPONINI in the last 168 hours. BNP (last 3 results) No results for input(s): PROBNP in the last 8760 hours. HbA1C: No results for input(s): HGBA1C in the last 72 hours. CBG: Recent Labs  Lab 11/29/20 1511  GLUCAP 101*   Lipid Profile: No results for input(s): CHOL, HDL, LDLCALC, TRIG, CHOLHDL, LDLDIRECT in the last 72 hours. Thyroid Function Tests: No results for input(s): TSH, T4TOTAL, FREET4, T3FREE, THYROIDAB in the last 72 hours. Anemia Panel: No results for input(s): VITAMINB12, FOLATE, FERRITIN, TIBC, IRON, RETICCTPCT in the last 72 hours. Sepsis Labs: No results for input(s): PROCALCITON, LATICACIDVEN in the last 168 hours.  Recent Results (from the past 240 hour(s))  Resp Panel by RT-PCR (Flu A&B, Covid) Nasopharyngeal Swab     Status: None   Collection Time: 11/29/20  3:09 PM   Specimen: Nasopharyngeal Swab; Nasopharyngeal(NP) swabs in vial transport medium  Result Value Ref Range Status   SARS Coronavirus 2 by RT PCR NEGATIVE NEGATIVE Final    Comment: (NOTE) SARS-CoV-2 target nucleic acids are NOT DETECTED.  The SARS-CoV-2 RNA is generally detectable in upper respiratory specimens during the acute phase of infection. The lowest concentration of SARS-CoV-2 viral copies this assay can detect is 138 copies/mL. A negative result does not preclude SARS-Cov-2 infection and should not be used as the sole basis for treatment or other patient management  decisions. A negative result may occur with  improper specimen collection/handling, submission of specimen other than nasopharyngeal swab, presence of viral mutation(s) within the areas targeted by this assay, and inadequate number of viral copies(<138 copies/mL). A negative result must be combined with clinical observations, patient history, and epidemiological information. The expected result is Negative.  Fact Sheet for Patients:  BloggerCourse.com  Fact Sheet for Healthcare Providers:  SeriousBroker.it  This test is no t yet approved or cleared by the [place] FDA and  has been authorized for detection and/or diagnosis of SARS-CoV-2 by FDA under an Emergency Use Authorization (EUA). This EUA will remain  in effect (meaning this test can be used) for the duration of the COVID-19 declaration under Section 564(b)(1) of the Act, 21 U.S.C.section 360bbb-3(b)(1), unless the authorization is terminated  or revoked sooner.       Influenza A  by PCR NEGATIVE NEGATIVE Final   Influenza B by PCR NEGATIVE NEGATIVE Final    Comment: (NOTE) The Xpert Xpress SARS-CoV-2/FLU/RSV plus assay is intended as an aid in the diagnosis of influenza from Nasopharyngeal swab specimens and should not be used as a sole basis for treatment. Nasal washings and aspirates are unacceptable for Xpert Xpress SARS-CoV-2/FLU/RSV testing.  Fact Sheet for Patients: BloggerCourse.comhttps://www.fda.gov/media/152166/download  Fact Sheet for Healthcare Providers: SeriousBroker.ithttps://www.fda.gov/media/152162/download  This test is not yet approved or cleared by the [place] FDA and has been authorized for detection and/or diagnosis of SARS-CoV-2 by FDA under an Emergency Use Authorization (EUA). This EUA will remain in effect (meaning this test can be used) for the duration of the COVID-19 declaration under Section 564(b)(1) of the Act, 21 U.S.C. section 360bbb-3(b)(1), unless the  authorization is terminated or revoked.  Performed at [facility], [address] [place], [place] [address]   Surgical pcr screen     Status: Abnormal   Collection Time: 11/29/20  6:51 PM   Specimen: Nasal Mucosa; Nasal Swab  Result Value Ref Range Status   MRSA, PCR POSITIVE (A) NEGATIVE Final   Staphylococcus aureus POSITIVE (A) NEGATIVE Final    Comment: RESULT CALLED TO, READ BACK BY AND VERIFIED WITH: MATTHEW WRIGHT @ 2031 11/29/2020 (NOTE) The Xpert SA Assay (FDA approved for NASAL specimens in patients 84 years of age and older), is one component of a comprehensive surveillance program. It is not intended to diagnose infection nor to guide or monitor treatment. Performed at [facility], [address] [place], [place] [address]          Radiology Studies: DG Ankle 2 Views Left  Result Date: 11/29/2020 CLINICAL DATA:  Left ankle fracture-dislocation status post reduction EXAM: LEFT ANKLE - 2 VIEW COMPARISON:  Same day radiographs FINDINGS: Persistent ankle dislocation with posterolateral translation of the talus relative to the distal tibia. Slight decreased angulation of distal fibular metaphyseal fracture. Moderately displaced medial malleolar fracture. Probable small posterior malleolar fracture. Diffuse soft tissue swelling. Overlying splint material. IMPRESSION: Trimalleolar left ankle fracture-dislocation with persistent posterolateral subluxation of the talus relative to the distal tibia. Electronically Signed   By: Duanne GuessNicholas  Plundo D.O.   On: 11/29/2020 17:55   DG Ankle Complete Left  Result Date: 11/29/2020 CLINICAL DATA:  Fall, pain, deformity EXAM: LEFT ANKLE COMPLETE - 3+ VIEW COMPARISON:  None. FINDINGS: There is fracture dislocation at the left ankle. Distal fibular metaphyseal fracture and medial malleolar fracture. Possible posterior malleolar fracture although difficult to determine/visualize. Lateral dislocation of the talus relative  to the tibia. IMPRESSION: Left ankle fracture dislocation as above. Electronically Signed   By: Charlett NoseKevin  Dover M.D.   On: 11/29/2020 15:30   MR BRAIN WO CONTRAST  Result Date: 11/29/2020 CLINICAL DATA:  Syncope EXAM: MRI HEAD WITHOUT CONTRAST TECHNIQUE: Multiplanar, multiecho pulse sequences of the brain and surrounding structures were obtained without intravenous contrast. COMPARISON:  None. FINDINGS: Brain: No acute infarct, mass effect or extra-axial collection. No acute or chronic hemorrhage. Hyperintense T2-weighted signal is moderately widespread throughout the white matter. Generalized volume loss without a clear lobar predilection. The midline structures are normal. Vascular: Major flow voids are preserved. Skull and upper cervical spine: Normal calvarium and skull base. Visualized upper cervical spine and soft tissues are normal. Sinuses/Orbits:Diffuse paranasal sinus mucosal thickening. Normal orbits. IMPRESSION: 1. No acute intracranial abnormality. 2. Generalized volume loss and findings of chronic small vessel ischemia. 3. Diffuse paranasal sinus mucosal thickening. Electronically Signed  By: Deatra Robinson M.D.   On: 11/29/2020 21:45        Scheduled Meds: . Chlorhexidine Gluconate Cloth  6 each Topical Q0600  . gabapentin  200 mg Oral TID  . latanoprost  1 drop Both Eyes QHS  . loratadine  10 mg Oral Daily  . mupirocin ointment  1 application Nasal BID  . oxybutynin  10 mg Oral Daily  . pantoprazole  40 mg Oral Daily  . pravastatin  20 mg Oral q1800  . venlafaxine XR  150 mg Oral Daily   Continuous Infusions: . sodium chloride 75 mL/hr at 11/29/20 1841  .  ceFAZolin (ANCEF) IV       LOS: 1 day    Time spent: 32 minutes    Marrion Coy, MD [facility]   To contact the attending provider between 7A-7P or the covering provider during after hours 7P-7A, please log into the web site [email or web address] and access using universal [place] password for that web site. If  you do not have the password, please call the hospital operator.  11/30/2020, 9:32 AM

## 2020-11-30 NOTE — Transfer of Care (Signed)
Immediate Anesthesia Transfer of Care Note  Patient: Sarah Higgins  Procedure(s) Performed: OPEN REDUCTION INTERNAL FIXATION (ORIF) ANKLE FRACTURE (Left Ankle)  Patient Location: PACU  Anesthesia Type:General  Level of Consciousness: awake, drowsy and patient cooperative  Airway & Oxygen Therapy: Patient Spontanous Breathing and Patient connected to face mask oxygen  Post-op Assessment: Report given to RN and Post -op Vital signs reviewed and stable  Post vital signs: Reviewed and stable  Last Vitals:  Vitals Value Taken Time  BP 115/64 11/30/20 1306  Temp    Pulse 76 11/30/20 1315  Resp 21 11/30/20 1315  SpO2 100 % 11/30/20 1315  Vitals shown include unvalidated device data.  Last Pain:  Vitals:   11/30/20 0542  TempSrc:   PainSc: 2          Complications: No complications documented.

## 2020-11-30 NOTE — Anesthesia Postprocedure Evaluation (Signed)
Anesthesia Post Note  Patient: Sarah Higgins  Procedure(s) Performed: OPEN REDUCTION INTERNAL FIXATION (ORIF) ANKLE FRACTURE (Left Ankle)  Patient location during evaluation: PACU Anesthesia Type: General Level of consciousness: awake and alert Pain management: pain level controlled Vital Signs Assessment: post-procedure vital signs reviewed and stable Respiratory status: spontaneous breathing, nonlabored ventilation, respiratory function stable and patient connected to nasal cannula oxygen Cardiovascular status: blood pressure returned to baseline and stable Postop Assessment: no apparent nausea or vomiting Anesthetic complications: no   No complications documented.   Last Vitals:  Vitals:   11/30/20 1808 11/30/20 2005  BP: 108/61 107/64  Pulse: 77 81  Resp: (!) 22 17  Temp: 36.4 C 36.9 C  SpO2:  99%    Last Pain:  Vitals:   11/30/20 2128  TempSrc:   PainSc: 5                  Lenard Simmer

## 2020-12-01 ENCOUNTER — Inpatient Hospital Stay: Payer: Medicare Other

## 2020-12-01 ENCOUNTER — Inpatient Hospital Stay (HOSPITAL_COMMUNITY)
Admit: 2020-12-01 | Discharge: 2020-12-01 | Disposition: A | Discharge status: Home / Self Care | Payer: Medicare Other | Attending: Orthopedic Surgery | Admitting: Orthopedic Surgery

## 2020-12-01 ENCOUNTER — Encounter: Payer: Self-pay | Admitting: Orthopedic Surgery

## 2020-12-01 DIAGNOSIS — S82852A Displaced trimalleolar fracture of left lower leg, initial encounter for closed fracture: Secondary | ICD-10-CM | POA: Diagnosis not present

## 2020-12-01 DIAGNOSIS — I351 Nonrheumatic aortic (valve) insufficiency: Secondary | ICD-10-CM | POA: Diagnosis not present

## 2020-12-01 DIAGNOSIS — I340 Nonrheumatic mitral (valve) insufficiency: Secondary | ICD-10-CM

## 2020-12-01 DIAGNOSIS — R55 Syncope and collapse: Secondary | ICD-10-CM | POA: Diagnosis not present

## 2020-12-01 LAB — CBC WITH DIFFERENTIAL/PLATELET
Abs Immature Granulocytes: 0.12 10*3/uL — ABNORMAL HIGH (ref 0.00–0.07)
Basophils Absolute: 0 10*3/uL (ref 0.0–0.1)
Basophils Relative: 0 %
Eosinophils Absolute: 0 10*3/uL (ref 0.0–0.5)
Eosinophils Relative: 0 %
HCT: 32.8 % — ABNORMAL LOW (ref 36.0–46.0)
Hemoglobin: 11 g/dL — ABNORMAL LOW (ref 12.0–15.0)
Immature Granulocytes: 1 %
Lymphocytes Relative: 16 %
Lymphs Abs: 2.9 10*3/uL (ref 0.7–4.0)
MCH: 28.1 pg (ref 26.0–34.0)
MCHC: 33.5 g/dL (ref 30.0–36.0)
MCV: 83.9 fL (ref 80.0–100.0)
Monocytes Absolute: 1.7 10*3/uL — ABNORMAL HIGH (ref 0.1–1.0)
Monocytes Relative: 9 %
Neutro Abs: 13.5 10*3/uL — ABNORMAL HIGH (ref 1.7–7.7)
Neutrophils Relative %: 74 %
Platelets: 161 10*3/uL (ref 150–400)
RBC: 3.91 MIL/uL (ref 3.87–5.11)
RDW: 12.8 % (ref 11.5–15.5)
WBC: 18.3 10*3/uL — ABNORMAL HIGH (ref 4.0–10.5)
nRBC: 0 % (ref 0.0–0.2)

## 2020-12-01 LAB — ECHOCARDIOGRAM LIMITED
Height: 67 in
S' Lateral: 2 cm
Weight: 3033.53 oz

## 2020-12-01 LAB — BASIC METABOLIC PANEL
Anion gap: 8 (ref 5–15)
BUN: 20 mg/dL (ref 8–23)
CO2: 26 mmol/L (ref 22–32)
Calcium: 9.2 mg/dL (ref 8.9–10.3)
Chloride: 102 mmol/L (ref 98–111)
Creatinine, Ser: 0.84 mg/dL (ref 0.44–1.00)
GFR, Estimated: >60 mL/min (ref >60)
Glucose, Bld: 141 mg/dL — ABNORMAL HIGH (ref 70–99)
Potassium: 4.3 mmol/L (ref 3.5–5.1)
Sodium: 136 mmol/L (ref 135–145)

## 2020-12-01 LAB — MAGNESIUM: Magnesium: 1.6 mg/dL — ABNORMAL LOW (ref 1.7–2.4)

## 2020-12-01 LAB — GLUCOSE, CAPILLARY: Glucose-Capillary: 109 mg/dL — ABNORMAL HIGH (ref 70–99)

## 2020-12-01 MED ORDER — MAGNESIUM SULFATE 2 GM/50ML IV SOLN
2.0000 g | Freq: Once | INTRAVENOUS | Status: AC
  Administered 2020-12-01: 2 g via INTRAVENOUS
  Filled 2020-12-01: qty 50

## 2020-12-01 MED ORDER — MAGNESIUM SULFATE IN D5W 1-5 GM/100ML-% IV SOLN
1.0000 g | Freq: Once | INTRAVENOUS | Status: AC
  Administered 2020-12-01: 1 g via INTRAVENOUS
  Filled 2020-12-01: qty 100

## 2020-12-01 MED ORDER — ENSURE MAX PROTEIN PO LIQD
11.0000 [oz_av] | Freq: Every day | ORAL | Status: DC
  Administered 2020-12-01 – 2020-12-02 (×2): 11 [oz_av] via ORAL
  Filled 2020-12-01: qty 330

## 2020-12-01 MED ORDER — MAGNESIUM SULFATE 50 % IJ SOLN: 3.0000 g | Freq: Once | INTRAVENOUS | Status: DC

## 2020-12-01 MED ORDER — ADULT MULTIVITAMIN W/MINERALS CH
1.0000 | ORAL_TABLET | Freq: Every day | ORAL | Status: DC
  Administered 2020-12-01 – 2020-12-03 (×3): 1 via ORAL
  Filled 2020-12-01 (×3): qty 1

## 2020-12-01 NOTE — TOC Progression Note (Signed)
Transition of Care [facility]) - Progression Note    Patient Details  Name: Sarah Higgins MRN: [id number] Date of Birth: 1935-02-06  Transition of Care [facility]) CM/SW [employer], RN Phone Number: 12/01/2020, 2:29 PM  Clinical Narrative:    Met with the patient in the room with her husband, I asked her if she would be willing to go to SNF for Short term rehab, she stated absolutely not, She stated that her husband and her niece will be helping her at home, she wants [facility] and a 3 in 1, she stated that she has a walker at home,       Expected Discharge Plan and Services                                                 Social Determinants of Health (SDOH) Interventions    Readmission Risk Interventions No flowsheet data found.

## 2020-12-01 NOTE — TOC Progression Note (Signed)
Transition of Care [facility]) - Progression Note    Patient Details  Name: Sarah Higgins MRN: [id number] Date of Birth: 1934/09/27  Transition of Care [facility]) CM/SW Contact  Barrie Dunker, RN Phone Number: 12/01/2020, 2:33 PM  Clinical Narrative:    [facility] to [facility], [facility], [facility], [facility] requesting to accept for [facility] PT, awaiting responce        Expected Discharge Plan and Services                                                 Social Determinants of Health (SDOH) Interventions    Readmission Risk Interventions No flowsheet data found.

## 2020-12-01 NOTE — Progress Notes (Signed)
Subjective:  POD #1 s/p ORIF of left trimalleolar ankle fracture and testing of right nondisplaced lateral malleolus fracture.   Patient reports bilateral ankle pain as mild.  Patient states she was not able to get up to the chair today.  Patient is nonweightbearing on both lower extremities due to her bilateral fractures.  Objective:   VITALS:   Vitals:   12/01/20 0443 12/01/20 0835 12/01/20 1245 12/01/20 1536  BP: 104/67 132/76 (!) 108/58 127/60  Pulse: 78 74 65 71  Resp: 17 (!) 24 20 (!) 22  Temp: 97.8 F (36.6 C) 98.1 F (36.7 C) 98.1 F (36.7 C) 98 F (36.7 C)  TempSrc: Oral Oral Oral Oral  SpO2: 98% 100%  98%  Weight:      Height:        PHYSICAL EXAM: Left lower extremity: Neurovascular intact Sensation intact distally Intact pulses distally Dorsiflexion/Plantar flexion intact Splint and dressing: Clean dry and intact, no drainage No cellulitis present Compartment soft  Right lower extremity: Splint is in place.  Patient is neurovascular intact.  LABS  Results for orders placed or performed during the hospital encounter of 11/29/20 (from the past 24 hour(s))  CBC with Differential/Platelet     Status: Abnormal   Collection Time: 12/01/20  5:09 AM  Result Value Ref Range   WBC 18.3 (H) 4.0 - 10.5 K/uL   RBC 3.91 3.87 - 5.11 MIL/uL   Hemoglobin 11.0 (L) 12.0 - 15.0 g/dL   HCT 45.432.8 (L) 09.836.0 - 11.946.0 %   MCV 83.9 80.0 - 100.0 fL   MCH 28.1 26.0 - 34.0 pg   MCHC 33.5 30.0 - 36.0 g/dL   RDW 14.712.8 82.911.5 - 56.215.5 %   Platelets 161 150 - 400 K/uL   nRBC 0.0 0.0 - 0.2 %   Neutrophils Relative % 74 %   Neutro Abs 13.5 (H) 1.7 - 7.7 K/uL   Lymphocytes Relative 16 %   Lymphs Abs 2.9 0.7 - 4.0 K/uL   Monocytes Relative 9 %   Monocytes Absolute 1.7 (H) 0.1 - 1.0 K/uL   Eosinophils Relative 0 %   Eosinophils Absolute 0.0 0.0 - 0.5 K/uL   Basophils Relative 0 %   Basophils Absolute 0.0 0.0 - 0.1 K/uL   Immature Granulocytes 1 %   Abs Immature Granulocytes 0.12 (H) 0.00  - 0.07 K/uL  Basic metabolic panel     Status: Abnormal   Collection Time: 12/01/20  5:09 AM  Result Value Ref Range   Sodium 136 135 - 145 mmol/L   Potassium 4.3 3.5 - 5.1 mmol/L   Chloride 102 98 - 111 mmol/L   CO2 26 22 - 32 mmol/L   Glucose, Bld 141 (H) 70 - 99 mg/dL   BUN 20 8 - 23 mg/dL   Creatinine, Ser 1.300.84 0.44 - 1.00 mg/dL   Calcium 9.2 8.9 - 86.510.3 mg/dL   GFR, Estimated >78>60 >46>60 mL/min   Anion gap 8 5 - 15  Magnesium     Status: Abnormal   Collection Time: 12/01/20  5:09 AM  Result Value Ref Range   Magnesium 1.6 (L) 1.7 - 2.4 mg/dL    X-ray chest PA or AP  Result Date: 11/30/2020 CLINICAL DATA:  Vomiting.  Assess for aspiration EXAM: CHEST  1 VIEW COMPARISON:  None. FINDINGS: Airspace disease seen in the right upper lobe, most predominantly platelike. Left base atelectasis. Heart is normal size. No effusions. IMPRESSION: Airspace disease in the right upper lobe predominantly is platelike suggesting  atelectasis although airspace disease elsewhere in the right upper lobe could reflect infiltrate. Left base atelectasis. Electronically Signed   By: Charlett Nose M.D.   On: 11/30/2020 13:34   DG Ankle 2 Views Left  Result Date: 11/29/2020 CLINICAL DATA:  Left ankle fracture-dislocation status post reduction EXAM: LEFT ANKLE - 2 VIEW COMPARISON:  Same day radiographs FINDINGS: Persistent ankle dislocation with posterolateral translation of the talus relative to the distal tibia. Slight decreased angulation of distal fibular metaphyseal fracture. Moderately displaced medial malleolar fracture. Probable small posterior malleolar fracture. Diffuse soft tissue swelling. Overlying splint material. IMPRESSION: Trimalleolar left ankle fracture-dislocation with persistent posterolateral subluxation of the talus relative to the distal tibia. Electronically Signed   By: Duanne Guess D.O.   On: 11/29/2020 17:55   DG Ankle Complete Left  Result Date: 11/30/2020 CLINICAL DATA:  ORIF EXAM:  LEFT ANKLE COMPLETE - 3+ VIEW COMPARISON:  None. FINDINGS: Plate and screw fixation within the distal fibula. Screw seen within the medial malleolus. Anatomic alignment. No hardware complicating feature. IMPRESSION: Internal fixation.  No visible complicating feature. Electronically Signed   By: Charlett Nose M.D.   On: 11/30/2020 14:44   X-ray ankle right AP lateral and oblique  Result Date: 11/30/2020 CLINICAL DATA:  Acute right ankle pain and swelling after fall yesterday. EXAM: RIGHT ANKLE - COMPLETE 3+ VIEW COMPARISON:  None. FINDINGS: Nondisplaced oblique fracture is seen involving the distal right fibula. Soft tissue swelling is seen over the lateral malleolus. Joint spaces are intact. IMPRESSION: Nondisplaced distal right fibular fracture. Electronically Signed   By: Lupita Raider M.D.   On: 11/30/2020 15:07   MR BRAIN WO CONTRAST  Result Date: 11/29/2020 CLINICAL DATA:  Syncope EXAM: MRI HEAD WITHOUT CONTRAST TECHNIQUE: Multiplanar, multiecho pulse sequences of the brain and surrounding structures were obtained without intravenous contrast. COMPARISON:  None. FINDINGS: Brain: No acute infarct, mass effect or extra-axial collection. No acute or chronic hemorrhage. Hyperintense T2-weighted signal is moderately widespread throughout the white matter. Generalized volume loss without a clear lobar predilection. The midline structures are normal. Vascular: Major flow voids are preserved. Skull and upper cervical spine: Normal calvarium and skull base. Visualized upper cervical spine and soft tissues are normal. Sinuses/Orbits:Diffuse paranasal sinus mucosal thickening. Normal orbits. IMPRESSION: 1. No acute intracranial abnormality. 2. Generalized volume loss and findings of chronic small vessel ischemia. 3. Diffuse paranasal sinus mucosal thickening. Electronically Signed   By: Deatra Robinson M.D.   On: 11/29/2020 21:45   DG MINI C-ARM IMAGE ONLY  Result Date: 11/30/2020 There is no interpretation  for this exam.  This order is for images obtained during a surgical procedure.  Please See "Surgeries" Tab for more information regarding the procedure.   DG HIP UNILAT WITH PELVIS 2-3 VIEWS LEFT  Result Date: 12/01/2020 CLINICAL DATA:  left hip pain since fall. EXAM: DG HIP (WITH OR WITHOUT PELVIS) 2-3V LEFT COMPARISON:  None. FINDINGS: AP view of the pelvis and AP views of the left hip. Femoral heads are located. No acute fracture. Left greater than right sacroiliac joint sclerosis is favored to be degenerative. Advanced lumbosacral junction spondylosis, suboptimally evaluated. IMPRESSION: No acute osseous abnormality. Electronically Signed   By: Jeronimo Greaves M.D.   On: 12/01/2020 13:51   ECHOCARDIOGRAM LIMITED  Result Date: 12/01/2020    ECHOCARDIOGRAM LIMITED REPORT   Patient Name:   CHASE KNEBEL Date of Exam: 12/01/2020 Medical Rec #:  [id number]     Height:  67.0 in Accession #:    [id number]    Weight:       189.6 lb Date of Birth:  12/31/1934     BSA:          1.977 m Patient Age:    86 years      BP:           104/67 mmHg Patient Gender: F             HR:           73 bpm. Exam Location:  [facility] Procedure: Limited Echo, Limited Color Doppler and Cardiac Doppler Indications:     R55 Syncope  History:         Patient has no prior history of Echocardiogram examinations.                  Risk Factors:Hypertension and Dyslipidemia.  Sonographer:     Humphrey Rolls RDCS (AE) Referring Phys:  [id number] Juanell Fairly Diagnosing Phys: Julien Nordmann MD IMPRESSIONS  1. Left ventricular ejection fraction, by estimation, is 60 to 65%. The left ventricle has normal function. The left ventricle has no regional wall motion abnormalities. Left ventricular diastolic parameters are indeterminate.  2. Right ventricular systolic function is normal. The right ventricular size is normal. Tricuspid regurgitation signal is inadequate for assessing PA pressure.  3. The mitral valve was not well visualized. Mild mitral valve  regurgitation.  4. The aortic valve was not well visualized. Aortic valve regurgitation is mild. Mild aortic valve sclerosis is present. No aortic valve gradient measured FINDINGS  Left Ventricle: Left ventricular ejection fraction, by estimation, is 60 to 65%. The left ventricle has normal function. The left ventricle has no regional wall motion abnormalities. The left ventricular internal cavity size was normal in size. There is  no left ventricular hypertrophy. Left ventricular diastolic parameters are indeterminate. Right Ventricle: The right ventricular size is normal. No increase in right ventricular wall thickness. Right ventricular systolic function is normal. Tricuspid regurgitation signal is inadequate for assessing PA pressure. Left Atrium: Left atrial size was normal in size. Right Atrium: Right atrial size was normal in size. Pericardium: There is no evidence of pericardial effusion. Mitral Valve: The mitral valve was not well visualized. Mild mitral valve regurgitation. No evidence of mitral valve stenosis. Tricuspid Valve: The tricuspid valve is normal in structure. Tricuspid valve regurgitation is not demonstrated. No evidence of tricuspid stenosis. Aortic Valve: The aortic valve was not well visualized. Aortic valve regurgitation is mild. Mild aortic valve sclerosis is present, with no evidence of aortic valve stenosis. Pulmonic Valve: The pulmonic valve was not well visualized. Pulmonic valve regurgitation is not visualized. No evidence of pulmonic stenosis. Aorta: The aortic root is normal in size and structure. Venous: The pulmonary veins were not well visualized. The inferior vena cava is normal in size with greater than 50% respiratory variability, suggesting right atrial pressure of 3 mmHg. IAS/Shunts: No atrial level shunt detected by color flow Doppler. LEFT VENTRICLE PLAX 2D LVIDd:         3.40 cm LVIDs:         2.00 cm LV PW:         0.90 cm LV IVS:        0.70 cm  LEFT ATRIUM          Index LA diam:    1.80 cm 0.91 cm/m Julien Nordmann MD Electronically signed by Julien Nordmann MD Signature Date/Time: 12/01/2020/8:47:48 AM  Final     Assessment/Plan: 1 Day Post-Op   Principal Problem:   Trimalleolar fracture of left ankle Active Problems:   HTN (hypertension)   HLD (hyperlipidemia)   Syncope   Fall   Hypomagnesemia   CKD (chronic kidney disease), stage II   GERD (gastroesophageal reflux disease)   Leukocytosis   Hypokalemia  Patient has bilateral ankle fractures status post fall.  The patient is bed to chair transfer.  I am recommending a skilled nursing facility for her but she is indicating she wants to go home with her husband and niece who will help take care of her.  I think logistically it will be difficult for her at home.  Patient will need a wheelchair with bilateral leg supports.  Continue physical therapy as the patient can tolerate.  Patient will have a cast or boot applied to the right leg before she is discharged.    Juanell Fairly , MD 12/01/2020, 4:05 PM

## 2020-12-01 NOTE — Plan of Care (Signed)

## 2020-12-01 NOTE — Progress Notes (Signed)
*  PRELIMINARY RESULTS* Echocardiogram 2D Echocardiogram has been performed.  Sarah Higgins 12/01/2020, 8:22 AM

## 2020-12-01 NOTE — Progress Notes (Signed)
PROGRESS NOTE    [name]  TMA:[id number] DOB: 05-31-1935 DOA: 11/29/2020 PCP: Patient, No Pcp Per (Inactive)   Chief complaint.  Syncope. Brief Narrative:  Sarah Higgins a 86 y.o.femalewith medical history significant ofhypertension, hyperlipidemia, GERD, CKD-2,who presents with syncope, fall and left ankle pain.  X-ray showed left ankle fracture.  EKG sinus, no significant prolongation of QTc.  Troponin negative.   Assessment & Plan:   Principal Problem:   Trimalleolar fracture of left ankle Active Problems:   HTN (hypertension)   HLD (hyperlipidemia)   Syncope   Fall   Hypomagnesemia   CKD (chronic kidney disease), stage II   GERD (gastroesophageal reflux disease)   Leukocytosis   Hypokalemia  #1.  Syncope and collapse. Reviewed echocardiogram, ejection fraction 60 to 65%, no significant valvular disease.  Limited she has no arrhythmia. Condition more likely due to vasovagal reaction.  2.  Left ankle fracture. Status post surgery. Patient doing well today.  Patient been seen by PT/OT, recommended nursing home placement.  3.  Hypomagnesemia. Hypokalemia. I potassium improved, given more magnesium.  #4.  Right hip pain. Obtain x-ray to rule out fracture.    DVT prophylaxis: Lovenox Code Status: Full Family Communication:  Disposition Plan:  .   Status is: Inpatient  Remains inpatient appropriate because:Inpatient level of care appropriate due to severity of illness   Dispo: The patient is from: Home              Anticipated d/c is to: SNF              Patient currently is not medically stable to d/c.   Difficult to place patient No        I/O last 3 completed shifts: In: 1298.8 [I.V.:998.8; IV Piggyback:300] Out: 762 [Urine:757; Blood:5] Total I/O In: 240 [P.O.:240] Out: -      Consultants:   Orthopedics  Procedures: Ankle  Antimicrobials:None  Subjective: Patient was seen by physical therapy, was complaining of right hip  pain while walking. Denies any short of breath or cough. No chest pain palpitation. No fever or chills. No dysuria or hematuria.   Objective: Vitals:   11/30/20 2005 12/01/20 0443 12/01/20 0835 12/01/20 1245  BP: 107/64 104/67 132/76 (!) 108/58  Pulse: 81 78 74 65  Resp: 17 17 (!) 24 20  Temp: 98.4 F (36.9 C) 97.8 F (36.6 C) 98.1 F (36.7 C) 98.1 F (36.7 C)  TempSrc: Oral Oral Oral Oral  SpO2: 99% 98% 100%   Weight:      Height:        Intake/Output Summary (Last 24 hours) at 12/01/2020 1330 Last data filed at 12/01/2020 1005 Gross per 24 hour  Intake 440 ml  Output 250 ml  Net 190 ml   Filed Weights   11/29/20 1507  Weight: 86 kg    Examination:  General exam: Appears calm and comfortable  Respiratory system: Clear to auscultation. Respiratory effort normal. Cardiovascular system: S1 & S2 heard, RRR. No JVD, murmurs, rubs, gallops or clicks. No pedal edema. Gastrointestinal system: Abdomen is nondistended, soft and nontender. No organomegaly or masses felt. Normal bowel sounds heard. Central nervous system: Alert and oriented. No focal neurological deficits. Extremities: Symmetric 5 x 5 power. Skin: No rashes, lesions or ulcers Psychiatry: Judgement and insight appear normal. Mood & affect appropriate.     Data Reviewed: I have personally reviewed following labs and imaging studies  CBC: Recent Labs  Lab 11/29/20 1509 11/30/20 0527 12/01/20 2563  WBC 17.2* 13.0* 18.3*  NEUTROABS 10.5*  --  13.5*  HGB 12.8 12.0 11.0*  HCT 39.7 37.3 32.8*  MCV 84.3 84.2 83.9  PLT 236 182 161   Basic Metabolic Panel: Recent Labs  Lab 11/29/20 1509 11/30/20 0527 12/01/20 0509  NA 138 134* 136  K 3.5 3.4* 4.3  CL 102 97* 102  CO2 GLUCOSE 99 112* 141*  BUN CREATININE 1.06* 0.78 0.84  CALCIUM 9.2 9.0 9.2  MG 1.6*  --  1.6*   GFR: Estimated Creatinine Clearance: 56.2 mL/min (by C-G formula based on SCr of 0.84 mg/dL). Liver Function  Tests: Recent Labs  Lab 11/29/20 1509  AST 20  ALT 16  ALKPHOS 83  BILITOT 0.7  PROT 7.7  ALBUMIN 3.8   Recent Labs  Lab 11/29/20 1509  LIPASE 73*   No results for input(s): AMMONIA in the last 168 hours. Coagulation Profile: Recent Labs  Lab 11/29/20 1654  INR 1.1   Cardiac Enzymes: No results for input(s): CKTOTAL, CKMB, CKMBINDEX, TROPONINI in the last 168 hours. BNP (last 3 results) No results for input(s): PROBNP in the last 8760 hours. HbA1C: No results for input(s): HGBA1C in the last 72 hours. CBG: Recent Labs  Lab 11/29/20 1511  GLUCAP 101*   Lipid Profile: No results for input(s): CHOL, HDL, LDLCALC, TRIG, CHOLHDL, LDLDIRECT in the last 72 hours. Thyroid Function Tests: No results for input(s): TSH, T4TOTAL, FREET4, T3FREE, THYROIDAB in the last 72 hours. Anemia Panel: No results for input(s): VITAMINB12, FOLATE, FERRITIN, TIBC, IRON, RETICCTPCT in the last 72 hours. Sepsis Labs: No results for input(s): PROCALCITON, LATICACIDVEN in the last 168 hours.  Recent Results (from the past 240 hour(s))  Resp Panel by RT-PCR (Flu A&B, Covid) Nasopharyngeal Swab     Status: None   Collection Time: 11/29/20  3:09 PM   Specimen: Nasopharyngeal Swab; Nasopharyngeal(NP) swabs in vial transport medium  Result Value Ref Range Status   SARS Coronavirus 2 by RT PCR NEGATIVE NEGATIVE Final    Comment: (NOTE) SARS-CoV-2 target nucleic acids are NOT DETECTED.  The SARS-CoV-2 RNA is generally detectable in upper respiratory specimens during the acute phase of infection. The lowest concentration of SARS-CoV-2 viral copies this assay can detect is 138 copies/mL. A negative result does not preclude SARS-Cov-2 infection and should not be used as the sole basis for treatment or other patient management decisions. A negative result may occur with  improper specimen collection/handling, submission of specimen other than nasopharyngeal swab, presence of viral mutation(s)  within the areas targeted by this assay, and inadequate number of viral copies(<138 copies/mL). A negative result must be combined with clinical observations, patient history, and epidemiological information. The expected result is Negative.  Fact Sheet for Patients:  BloggerCourse.com  Fact Sheet for Healthcare Providers:  SeriousBroker.it  This test is no t yet approved or cleared by the [place] FDA and  has been authorized for detection and/or diagnosis of SARS-CoV-2 by FDA under an Emergency Use Authorization (EUA). This EUA will remain  in effect (meaning this test can be used) for the duration of the COVID-19 declaration under Section 564(b)(1) of the Act, 21 U.S.C.section 360bbb-3(b)(1), unless the authorization is terminated  or revoked sooner.       Influenza A by PCR NEGATIVE NEGATIVE Final   Influenza B by PCR NEGATIVE NEGATIVE Final    Comment: (NOTE) The Xpert Xpress SARS-CoV-2/FLU/RSV plus assay is intended as an aid in the diagnosis  of influenza from Nasopharyngeal swab specimens and should not be used as a sole basis for treatment. Nasal washings and aspirates are unacceptable for Xpert Xpress SARS-CoV-2/FLU/RSV testing.  Fact Sheet for Patients: BloggerCourse.com  Fact Sheet for Healthcare Providers: SeriousBroker.it  This test is not yet approved or cleared by the [place] FDA and has been authorized for detection and/or diagnosis of SARS-CoV-2 by FDA under an Emergency Use Authorization (EUA). This EUA will remain in effect (meaning this test can be used) for the duration of the COVID-19 declaration under Section 564(b)(1) of the Act, 21 U.S.C. section 360bbb-3(b)(1), unless the authorization is terminated or revoked.  Performed at [facility], [address] [place], [place] [address]   Surgical pcr screen     Status: Abnormal    Collection Time: 11/29/20  6:51 PM   Specimen: Nasal Mucosa; Nasal Swab  Result Value Ref Range Status   MRSA, PCR POSITIVE (A) NEGATIVE Final   Staphylococcus aureus POSITIVE (A) NEGATIVE Final    Comment: RESULT CALLED TO, READ BACK BY AND VERIFIED WITH: MATTHEW WRIGHT @ 2031 11/29/2020 (NOTE) The Xpert SA Assay (FDA approved for NASAL specimens in patients 25 years of age and older), is one component of a comprehensive surveillance program. It is not intended to diagnose infection nor to guide or monitor treatment. Performed at [facility], [address], [place], [place] [address]          Radiology Studies: X-ray chest PA or AP  Result Date: 11/30/2020 CLINICAL DATA:  Vomiting.  Assess for aspiration EXAM: CHEST  1 VIEW COMPARISON:  None. FINDINGS: Airspace disease seen in the right upper lobe, most predominantly platelike. Left base atelectasis. Heart is normal size. No effusions. IMPRESSION: Airspace disease in the right upper lobe predominantly is platelike suggesting atelectasis although airspace disease elsewhere in the right upper lobe could reflect infiltrate. Left base atelectasis. Electronically Signed   By: Charlett Nose M.D.   On: 11/30/2020 13:34   DG Ankle 2 Views Left  Result Date: 11/29/2020 CLINICAL DATA:  Left ankle fracture-dislocation status post reduction EXAM: LEFT ANKLE - 2 VIEW COMPARISON:  Same day radiographs FINDINGS: Persistent ankle dislocation with posterolateral translation of the talus relative to the distal tibia. Slight decreased angulation of distal fibular metaphyseal fracture. Moderately displaced medial malleolar fracture. Probable small posterior malleolar fracture. Diffuse soft tissue swelling. Overlying splint material. IMPRESSION: Trimalleolar left ankle fracture-dislocation with persistent posterolateral subluxation of the talus relative to the distal tibia. Electronically Signed   By: Duanne Guess D.O.   On: 11/29/2020 17:55    DG Ankle Complete Left  Result Date: 11/30/2020 CLINICAL DATA:  ORIF EXAM: LEFT ANKLE COMPLETE - 3+ VIEW COMPARISON:  None. FINDINGS: Plate and screw fixation within the distal fibula. Screw seen within the medial malleolus. Anatomic alignment. No hardware complicating feature. IMPRESSION: Internal fixation.  No visible complicating feature. Electronically Signed   By: Charlett Nose M.D.   On: 11/30/2020 14:44   DG Ankle Complete Left  Result Date: 11/29/2020 CLINICAL DATA:  Fall, pain, deformity EXAM: LEFT ANKLE COMPLETE - 3+ VIEW COMPARISON:  None. FINDINGS: There is fracture dislocation at the left ankle. Distal fibular metaphyseal fracture and medial malleolar fracture. Possible posterior malleolar fracture although difficult to determine/visualize. Lateral dislocation of the talus relative to the tibia. IMPRESSION: Left ankle fracture dislocation as above. Electronically Signed   By: Charlett Nose M.D.   On: 11/29/2020 15:30   X-ray ankle right AP lateral and oblique  Result Date: 11/30/2020 CLINICAL DATA:  Acute right ankle pain and swelling after fall yesterday. EXAM: RIGHT ANKLE - COMPLETE 3+ VIEW COMPARISON:  None. FINDINGS: Nondisplaced oblique fracture is seen involving the distal right fibula. Soft tissue swelling is seen over the lateral malleolus. Joint spaces are intact. IMPRESSION: Nondisplaced distal right fibular fracture. Electronically Signed   By: Lupita RaiderJames  Green Jr M.D.   On: 11/30/2020 15:07   MR BRAIN WO CONTRAST  Result Date: 11/29/2020 CLINICAL DATA:  Syncope EXAM: MRI HEAD WITHOUT CONTRAST TECHNIQUE: Multiplanar, multiecho pulse sequences of the brain and surrounding structures were obtained without intravenous contrast. COMPARISON:  None. FINDINGS: Brain: No acute infarct, mass effect or extra-axial collection. No acute or chronic hemorrhage. Hyperintense T2-weighted signal is moderately widespread throughout the white matter. Generalized volume loss without a clear lobar  predilection. The midline structures are normal. Vascular: Major flow voids are preserved. Skull and upper cervical spine: Normal calvarium and skull base. Visualized upper cervical spine and soft tissues are normal. Sinuses/Orbits:Diffuse paranasal sinus mucosal thickening. Normal orbits. IMPRESSION: 1. No acute intracranial abnormality. 2. Generalized volume loss and findings of chronic small vessel ischemia. 3. Diffuse paranasal sinus mucosal thickening. Electronically Signed   By: Deatra RobinsonKevin  Herman M.D.   On: 11/29/2020 21:45   DG MINI C-ARM IMAGE ONLY  Result Date: 11/30/2020 There is no interpretation for this exam.  This order is for images obtained during a surgical procedure.  Please See "Surgeries" Tab for more information regarding the procedure.   ECHOCARDIOGRAM LIMITED  Result Date: 12/01/2020    ECHOCARDIOGRAM LIMITED REPORT   Patient Name:   Consuella LoseJOANN J Kiene Date of Exam: 12/01/2020 Medical Rec #:  [id number]     Height:       67.0 in Accession #:    [id number]    Weight:       189.6 lb Date of Birth:  1935/06/18     BSA:          1.977 m Patient Age:    86 years      BP:           104/67 mmHg Patient Gender: F             HR:           73 bpm. Exam Location:  [facility] Procedure: Limited Echo, Limited Color Doppler and Cardiac Doppler Indications:     R55 Syncope  History:         Patient has no prior history of Echocardiogram examinations.                  Risk Factors:Hypertension and Dyslipidemia.  Sonographer:     Humphrey RollsJoan Heiss RDCS (AE) Referring Phys:  [id number] Juanell FairlyKEVIN KRASINSKI Diagnosing Phys: Julien Nordmannimothy Gollan MD IMPRESSIONS  1. Left ventricular ejection fraction, by estimation, is 60 to 65%. The left ventricle has normal function. The left ventricle has no regional wall motion abnormalities. Left ventricular diastolic parameters are indeterminate.  2. Right ventricular systolic function is normal. The right ventricular size is normal. Tricuspid regurgitation signal is inadequate for assessing PA pressure.   3. The mitral valve was not well visualized. Mild mitral valve regurgitation.  4. The aortic valve was not well visualized. Aortic valve regurgitation is mild. Mild aortic valve sclerosis is present. No aortic valve gradient measured FINDINGS  Left Ventricle: Left ventricular ejection fraction, by estimation, is 60 to 65%. The left ventricle has normal function. The left ventricle has no regional wall motion abnormalities. The left  ventricular internal cavity size was normal in size. There is  no left ventricular hypertrophy. Left ventricular diastolic parameters are indeterminate. Right Ventricle: The right ventricular size is normal. No increase in right ventricular wall thickness. Right ventricular systolic function is normal. Tricuspid regurgitation signal is inadequate for assessing PA pressure. Left Atrium: Left atrial size was normal in size. Right Atrium: Right atrial size was normal in size. Pericardium: There is no evidence of pericardial effusion. Mitral Valve: The mitral valve was not well visualized. Mild mitral valve regurgitation. No evidence of mitral valve stenosis. Tricuspid Valve: The tricuspid valve is normal in structure. Tricuspid valve regurgitation is not demonstrated. No evidence of tricuspid stenosis. Aortic Valve: The aortic valve was not well visualized. Aortic valve regurgitation is mild. Mild aortic valve sclerosis is present, with no evidence of aortic valve stenosis. Pulmonic Valve: The pulmonic valve was not well visualized. Pulmonic valve regurgitation is not visualized. No evidence of pulmonic stenosis. Aorta: The aortic root is normal in size and structure. Venous: The pulmonary veins were not well visualized. The inferior vena cava is normal in size with greater than 50% respiratory variability, suggesting right atrial pressure of 3 mmHg. IAS/Shunts: No atrial level shunt detected by color flow Doppler. LEFT VENTRICLE PLAX 2D LVIDd:         3.40 cm LVIDs:         2.00 cm LV PW:          0.90 cm LV IVS:        0.70 cm  LEFT ATRIUM         Index LA diam:    1.80 cm 0.91 cm/m Julien Nordmann MD Electronically signed by Julien Nordmann MD Signature Date/Time: 12/01/2020/8:47:48 AM    Final         Scheduled Meds: . Chlorhexidine Gluconate Cloth  6 each Topical Q0600  . docusate sodium  100 mg Oral BID  . enoxaparin (LOVENOX) injection  40 mg Subcutaneous Q24H  . gabapentin  200 mg Oral TID  . latanoprost  1 drop Both Eyes QHS  . loratadine  10 mg Oral Daily  . mupirocin ointment  1 application Nasal BID  . oxybutynin  10 mg Oral Daily  . pantoprazole  40 mg Oral Daily  . pravastatin  20 mg Oral q1800  . senna  1 tablet Oral BID  . traMADol  50 mg Oral Q6H  . venlafaxine XR  150 mg Oral Daily   Continuous Infusions: . sodium chloride 75 mL/hr at 11/29/20 1841  . magnesium sulfate bolus IVPB 1 g (12/01/20 1244)  . methocarbamol (ROBAXIN) IV       LOS: 2 days    Time spent: 27 minutes    Marrion Coy, MD [facility]   To contact the attending provider between 7A-7P or the covering provider during after hours 7P-7A, please log into the web site [email or web address] and access using universal [place] password for that web site. If you do not have the password, please call the hospital operator.  12/01/2020, 1:30 PM

## 2020-12-01 NOTE — Progress Notes (Signed)
Initial Nutrition Assessment  DOCUMENTATION CODES:  Not applicable  INTERVENTION:   Continue current diet as ordered, encourage PO intake  MVI with minerals daily  Ensure Max po BID, each supplement provides 150 kcal and 30 grams of protein.   NUTRITION DIAGNOSIS:  Increased nutrient needs related to post-op healing as evidenced by estimated needs.  GOAL:  Patient will meet greater than or equal to 90% of their needs  MONITOR:  PO intake,Labs,Skin  REASON FOR ASSESSMENT:  Consult Hip fracture protocol  ASSESSMENT:  Pt presented to ED after a syncopal episode at home with obvious ankle injury. She has never had passing out episodes before. XR in ED showed a left ankle fracture. PMH relevant for HLD, HTN, GERD  Pt underwent surgical repair of left ankle fracture 5/22. Post-surgery, RN noted pain and swelling to right ankle. Imaging was obtained and showed a nondisplaced right lateral malleolus fracture to the right ankle.  Pt resting in bed at the time of visit, family at bedside. Pt reports a good appetite and that she is feeling well today. Pt reports she has a good appetite at home as well and does not drink nutrition supplements. Pt does endorse some constipation, noted that bowel regimen is ordered   5/22 - ORIF of the left trimalleolar ankle fracture  Average Meal Intake: . 5/21-5/23: 85% intake x 1 recorded meal  Nutritionally Relevant Medications: Scheduled Meds: . docusate sodium  100 mg Oral BID  . pantoprazole  40 mg Oral Daily  . pravastatin  20 mg Oral q1800  . senna  1 tablet Oral BID   Continuous Infusions: . sodium chloride 75 mL/hr at 11/29/20 1841   PRN Meds: alum & mag hydroxide-simeth, bisacodyl, loperamide, magnesium citrate, ondansetron, polyethylene glycol, senna-docusate  Nutritionally Relevant Labs Reviewed:  Mg 1.6 (L)  SBG ranges from 112-141 mg/dL over the last 24 hours  NUTRITION - FOCUSED PHYSICAL EXAM: Flowsheet Row Most Recent  Value  Orbital Region No depletion  Upper Arm Region No depletion  Thoracic and Lumbar Region No depletion  Buccal Region No depletion  Temple Region Mild depletion  Clavicle Bone Region Mild depletion  Clavicle and Acromion Bone Region No depletion  Dorsal Hand No depletion  Patellar Region No depletion  Anterior Thigh Region No depletion  Posterior Calf Region No depletion  Edema (RD Assessment) None  Hair Reviewed  Eyes Reviewed  Mouth Reviewed  Skin Reviewed  Nails Reviewed     Diet Order:   Diet Order            Diet regular Room service appropriate? Yes; Fluid consistency: Thin  Diet effective now                 EDUCATION NEEDS:  No education needs have been identified at this time  Skin:  Skin Assessment: Skin Integrity Issues: Skin Integrity Issues:: Incisions Incisions: left ankle  Last BM:  5/20 - per RN documentation  Height:  Ht Readings from Last 1 Encounters:  11/29/20 5\' 7"  (1.702 m)    Weight:  Wt Readings from Last 1 Encounters:  11/29/20 86 kg    Ideal Body Weight:  61.4 kg  BMI:  Body mass index is 29.69 kg/m.  Estimated Nutritional Needs:   Kcal:  1500-1700 kcal/d  Protein:  75-85 g/d  Fluid:  >1.5L/d   12/01/20, RD, LDN Clinical Dietitian Pager on Amion

## 2020-12-01 NOTE — Evaluation (Signed)
Occupational Therapy Evaluation Patient Details Name: Sarah Higgins MRN: [id number] DOB: May 16, 1935 Today's Date: 12/01/2020    History of Present Illness Patient is a 86 y.o. female who sustained an injury to her left ankle during a syncopal episode at home today.  Patient was noted to have deformity to the left ankle after her fall and found to have left closed trimalleolar ankle fracture dislocation. Status post ORIF of left ankle. Right ankle X-rays were taken after patient complains of right ankle pain in PACU and found to have nondisplaced right lateral malleolus fracture and splint placed. medical history of hypertension, hyperlipidemia, GERD, CKD-2   Clinical Impression   Pt was seen for OT evaluation and PT co-tx this date to address ADL transfers. Prior to hospital admission, pt was independent aside from occasional use of a [facility] for community mobility. She enjoys singing in her church choir. Pt lives with her spouse and states she wants to go home at discharge if possible. Patient educated on weight bearing status and was able to maintain with cues during session. Blood pressure monitored throughout session with no complaints of dizziness reported. Supine BP123/76, sitting BP 115/62mmHg, sitting BP 128/38mmHg. Transfer training initiated today using sliding board. Patient completed partial transfer from bed to recliner chair with a drop arm and then experiences left hip pain (8/10) that limited progression of further activity. Patient needed increased assistance to return to bed. Left hip is also tender to palpation (interdiciplinary team notified of left hip pain). Currently pt demonstrates impairments as described below (See OT problem list) which functionally limit her ability to perform ADL/self-care tasks and ADL mobility significantly. Pt would benefit from skilled OT services to address noted impairments and functional limitations (see below for any additional details) in order to  maximize safety and independence while minimizing falls risk and caregiver burden. Upon hospital discharge, recommend STR to maximize pt safety and return to PLOF.     Follow Up Recommendations  SNF    Equipment Recommendations  3 in 1 bedside commode    Recommendations for Other Services       Precautions / Restrictions Precautions Precautions: Fall Restrictions Weight Bearing Restrictions: Yes RLE Weight Bearing: Non weight bearing LLE Weight Bearing: Non weight bearing      Mobility Bed Mobility Overal bed mobility: Needs Assistance Bed Mobility: Supine to Sit;Sit to Supine;Rolling Rolling: Min guard   Supine to sit: Min assist Sit to supine: +2 for physical assistance;Min assist   General bed mobility comments: increased assistance required for return to bed. verbal cues for technique and sequencing to complete all tasks    Transfers Overall transfer level: Needs assistance Equipment used: Sliding board Transfers: Lateral/Scoot Transfers          Lateral/Scoot Transfers: Mod assist;With slide board General transfer comment: patient was able to perform seated lateral scooting with Min guard assistance initially. Sliding board set up to attempt transfer from bed to recliner chair with drop arm. Patient was able to get 1/2 way to the chair, scooting to right side, and then complained of 8/10 left hip pain which was not imaged during this hospital stay per chart review. patient required increased assistance to return to bed, scooting to the left. maximal verbal and visual cues provided for technique for slide board transer and patient able to maintain NWB of BLE throughout session. patient was limited by fatigue and requird multiple seated rest breaks.    Balance Overall balance assessment: Needs assistance Sitting-balance support: Feet unsupported;Bilateral  upper extremity supported Sitting balance-Leahy Scale: Good                                      ADL either performed or assessed with clinical judgement   ADL Overall ADL's : Needs assistance/impaired                                       General ADL Comments: Pt currently requires MOD A for LB ADL tasks involving lateral leans/scoots, +2 assist for scoot transfers (able to attempt but unable to complete)     Vision Baseline Vision/History: Wears glasses Wears Glasses: Reading only Patient Visual Report: No change from baseline       Perception     Praxis      Pertinent Vitals/Pain Pain Assessment: 0-10 Pain Score: 8  Pain Location: left hip, left ankle Pain Descriptors / Indicators: Discomfort;Grimacing;Guarding Pain Intervention(s): Limited activity within patient's tolerance;Monitored during session;Premedicated before session;Repositioned;Patient requesting pain meds-RN notified     Hand Dominance     Extremity/Trunk Assessment Upper Extremity Assessment Upper Extremity Assessment: Overall WFL for tasks assessed   Lower Extremity Assessment Lower Extremity Assessment: Defer to PT evaluation (patient able to activate hip, knee movement BLE. patient can activate toe movement bilaterally. ankle movement not assessed as patient with bilateral ankle splints maintained throughout session)       Communication Communication Communication: No difficulties   Cognition Arousal/Alertness: Awake/alert Behavior During Therapy: WFL for tasks assessed/performed (giddy) Overall Cognitive Status: Within Functional Limits for tasks assessed                                     General Comments       Exercises Other Exercises Other Exercises: Pt educated in NWBing precautions Other Exercises: Bed mobility and slide board transfer training with OT/PT   Shoulder Instructions      Home Living Family/patient expects to be discharged to:: Private residence Living Arrangements: Spouse/significant other Available Help at Discharge:  Family Type of Home: Mobile home Home Access: Stairs to enter Entrance Stairs-Number of Steps: 5 (3, then a landing, then 2 more) Entrance Stairs-Rails: Can reach both Home Layout: One level     Bathroom Shower/Tub: Producer, television/film/video: Standard     Home Equipment: Environmental consultant - 4 wheels;Cane - single point;Shower seat;Grab bars - tub/shower          Prior Functioning/Environment Level of Independence: Independent with assistive device(s)        Comments: Patient reports she uses a single point cane intermittently in the community. She sometimes uses furniture for support to walk around her home. No falls reported in the past 6 months other than fall that brought her in the hospital        OT Problem List: Decreased strength;Pain;Decreased range of motion;Impaired balance (sitting and/or standing);Decreased activity tolerance;Decreased knowledge of use of DME or AE;Decreased knowledge of precautions      OT Treatment/Interventions: Self-care/ADL training;Therapeutic exercise;Therapeutic activities;DME and/or AE instruction;Patient/family education;Balance training    OT Goals(Current goals can be found in the care plan section) Acute Rehab OT Goals Patient Stated Goal: to go home with help from spouse OT Goal Formulation: With patient Time For Goal Achievement: 12/15/20 Potential to  Achieve Goals: Good ADL Goals Pt Will Perform Lower Body Dressing: with supervision;with set-up;sitting/lateral leans Pt Will Transfer to Toilet: with min assist;with transfer board;bedside commode Additional ADL Goal #1: Pt will verbalize plan to implement at least 2 learned falls preevntion strategies  OT Frequency: Min 2X/week   Barriers to D/C: Inaccessible home environment          Co-evaluation PT/OT/SLP Co-Evaluation/Treatment: Yes Reason for Co-Treatment: For patient/therapist safety;To address functional/ADL transfers PT goals addressed during session: Mobility/safety  with mobility OT goals addressed during session: ADL's and self-care      AM-PAC OT "6 Clicks" Daily Activity     Outcome Measure Help from another person eating meals?: None Help from another person taking care of personal grooming?: A Little Help from another person toileting, which includes using toliet, bedpan, or urinal?: A Lot Help from another person bathing (including washing, rinsing, drying)?: A Lot Help from another person to put on and taking off regular upper body clothing?: A Little Help from another person to put on and taking off regular lower body clothing?: A Lot 6 Click Score: 16   End of Session Nurse Communication: Patient requests pain meds;Mobility status  Activity Tolerance: Patient limited by pain Patient left: in bed;with call bell/phone within reach;with bed alarm set;Other (comment) (BLE elevated, ice packs refilled and reapplied)  OT Visit Diagnosis: Other abnormalities of gait and mobility (R26.89);History of falling (Z91.81);Pain Pain - Right/Left: Left Pain - part of body: Ankle and joints of foot;Hip                Time: 8469-6295 OT Time Calculation (min): 43 min Charges:  OT General Charges $OT Visit: 1 Visit OT Evaluation $OT Eval Moderate Complexity: 1 Mod OT Treatments $Self Care/Home Management : 8-22 mins  Wynona Canes, MPH, MS, OTR/L ascom [phone] 12/01/20, 1:05 PM

## 2020-12-01 NOTE — Evaluation (Signed)
Physical Therapy Evaluation Patient Details Name: Sarah Higgins MRN: [id number] DOB: [birth year] Today's Date: 12/01/2020   History of Present Illness  Patient is a 86 y.o. female who sustained an injury to her left ankle during a syncopal episode at home today.  Patient was noted to have deformity to the left ankle after her fall and found to have left closed trimalleolar ankle fracture dislocation. Status post ORIF of left ankle. Right ankle X-rays were taken after patient complains of right ankle pain in PACU and found to have nondisplaced right lateral malleolus fracture and splint placed. medical history of hypertension, hyperlipidemia, GERD, CKD-2  Clinical Impression  Patient pleasant and motivated during session. Patient reports she is independent with occasional use of single point cane for community ambulation at baseline. Patient lives with her spouse and states she wants to go home at discharge if possible.  Patient educated on weight bearing status and was able to maintain with cues during session. Blood pressure monitored throughout session with no complaints of dizziness reported. Supine BP123/76, sitting BP 115/66mmHg. Transfer training initiated today using sliding board. Patient completed partial transfer from bed to recliner chair with a drop arm and then experiences left hip pain (8/10) that limited progression of further activity. Patient needed increased assistance to return to bed. Left hip is also tender to palpation (interdiciplinary team notified of left hip pain).  Patient needs continued PT to maximize independence and functional mobility training within limits of weight bearing restrictions of bilateral LE non weight bearing. Recommend SNF placement at discharge for ongoing PT efforts prior to going home with family support.    Follow Up Recommendations SNF    Equipment Recommendations   (to be determined at next level of care)    Recommendations for Other Services        Precautions / Restrictions Precautions Precautions: Fall Restrictions Weight Bearing Restrictions: Yes RLE Weight Bearing: Non weight bearing LLE Weight Bearing: Non weight bearing      Mobility  Bed Mobility Overal bed mobility: Needs Assistance Bed Mobility: Supine to Sit;Sit to Supine;Rolling Rolling: Min guard   Supine to sit: Min assist Sit to supine: +2 for physical assistance;Min assist   General bed mobility comments: increased assistance required for return to bed. verbal cues for technique and sequencing to complete all tasks    Transfers Overall transfer level: Needs assistance Equipment used: Sliding board Transfers: Lateral/Scoot Transfers          Lateral/Scoot Transfers: Mod assist;With slide board General transfer comment: patient was able to perform seated lateral scooting with Min guard assistance initially. Sliding board set up to attempt transfer from bed to recliner chair with drop arm. Patient was able to get 1/2 way to the chair, scooting to right side, and then complained of 8/10 left hip pain which was not imaged during this hospital stay per chart review. patient required increased assistance to return to bed, scooting to the left. maximal verbal and visual cues provided for technique for slide board transer and patient able to maintain NWB of BLE throughout session. patient was limited by fatigue and requird multiple seated rest breaks.  Ambulation/Gait         Gait velocity: unable due to current weight bearing restrictions      Stairs            Wheelchair Mobility    Modified Rankin (Stroke Patients Only)       Balance Overall balance assessment: Needs assistance Sitting-balance support: Feet unsupported;Bilateral  upper extremity supported Sitting balance-Leahy Scale: Good                                       Pertinent Vitals/Pain Pain Assessment: 0-10 Pain Score: 8  (8/10 left hip with movement;  6/10 left ankle with movement) Pain Location: left hip, left ankle Pain Descriptors / Indicators: Discomfort Pain Intervention(s): Limited activity within patient's tolerance;Premedicated before session;Ice applied (ice packs applied to bilateral ankles and left hip)    Home Living Family/patient expects to be discharged to:: Private residence Living Arrangements: Spouse/significant other Available Help at Discharge: Family Type of Home: Mobile home Home Access: Stairs to enter Entrance Stairs-Rails: Can reach both Entrance Stairs-Number of Steps: 5 (3, then a landing, then 2 more) Home Layout: One level Home Equipment: Walker - 4 wheels;Cane - single point;Shower seat;Grab bars - tub/shower      Prior Function Level of Independence: Independent with assistive device(s)         Comments: Patient reports she uses a single point cane intermittently in the community. She sometimes uses furniture for support to walk around her home. No falls reported in the past 6 months other than fall that brought her in the hospital     Hand Dominance        Extremity/Trunk Assessment   Upper Extremity Assessment Upper Extremity Assessment: Overall WFL for tasks assessed    Lower Extremity Assessment Lower Extremity Assessment:  (patient able to activate hip, knee movement BLE. patient can activate toe movement bilaterally. ankle movement not assessed as patient with bilateral ankle splints maintained throughout session)       Communication   Communication: No difficulties  Cognition Arousal/Alertness: Awake/alert Behavior During Therapy: WFL for tasks assessed/performed (giddy) Overall Cognitive Status: Within Functional Limits for tasks assessed                                        General Comments      Exercises     Assessment/Plan    PT Assessment Patient needs continued PT services  PT Problem List Decreased strength;Decreased range of motion;Decreased  activity tolerance;Decreased balance;Decreased mobility;Decreased coordination;Decreased safety awareness;Decreased knowledge of use of DME;Pain       PT Treatment Interventions DME instruction;Functional mobility training;Therapeutic activities;Therapeutic exercise;Balance training;Neuromuscular re-education;Patient/family education;Wheelchair mobility training    PT Goals (Current goals can be found in the Care Plan section)  Acute Rehab PT Goals Patient Stated Goal: to go home with help from spouse PT Goal Formulation: With patient Time For Goal Achievement: 12/15/20 Potential to Achieve Goals: Good Additional Goals Additional Goal #1: patient will propel wheelchair 27ft with supervision and minimal verbal cues while maintaining NWB of BLE in preparation for home and community mobility    Frequency 7X/week   Barriers to discharge        Co-evaluation PT/OT/SLP Co-Evaluation/Treatment: Yes Reason for Co-Treatment: To address functional/ADL transfers PT goals addressed during session: Mobility/safety with mobility         AM-PAC PT "6 Clicks" Mobility  Outcome Measure Help needed turning from your back to your side while in a flat bed without using bedrails?: A Little Help needed moving from lying on your back to sitting on the side of a flat bed without using bedrails?: A Little Help needed moving to and from a bed  to a chair (including a wheelchair)?: A Lot Help needed standing up from a chair using your arms (e.g., wheelchair or bedside chair)?: Total Help needed to walk in hospital room?: Total Help needed climbing 3-5 steps with a railing? : Total 6 Click Score: 11    End of Session   Activity Tolerance: Patient limited by pain Patient left: in bed;with call bell/phone within reach;with bed alarm set (ice packs applied to bilateral ankles, left hip) Nurse Communication: Mobility status (secure chat sent by OT to the interdiciplinary team about left hip pain (x-ray to  be ordered per MD)) PT Visit Diagnosis: Difficulty in walking, not elsewhere classified (R26.2);Muscle weakness (generalized) (M62.81)    Time: 3875-6433 PT Time Calculation (min) (ACUTE ONLY): 46 min   Charges:   PT Evaluation $PT Eval Moderate Complexity: 1 Mod PT Treatments $Therapeutic Activity: 8-22 mins        Donna Bernard, PT, MPT   Ina Homes 12/01/2020, 10:37 AM

## 2020-12-02 DIAGNOSIS — R55 Syncope and collapse: Secondary | ICD-10-CM | POA: Diagnosis not present

## 2020-12-02 DIAGNOSIS — I10 Essential (primary) hypertension: Secondary | ICD-10-CM | POA: Diagnosis not present

## 2020-12-02 DIAGNOSIS — S82852A Displaced trimalleolar fracture of left lower leg, initial encounter for closed fracture: Secondary | ICD-10-CM | POA: Diagnosis not present

## 2020-12-02 LAB — RESP PANEL BY RT-PCR (FLU A&B, COVID) ARPGX2
Influenza A by PCR: NEGATIVE
Influenza B by PCR: NEGATIVE
SARS Coronavirus 2 by RT PCR: NEGATIVE

## 2020-12-02 NOTE — Progress Notes (Signed)
Occupational Therapy Treatment Patient Details Name: Sarah Higgins MRN: [id number] DOB: 1934/09/13 Today's Date: 12/02/2020    History of present illness Patient is a 86 y.o. female who sustained an injury to her left ankle during a syncopal episode at home today.  Patient was noted to have deformity to the left ankle after her fall and found to have left closed trimalleolar ankle fracture dislocation. Status post ORIF of left ankle. Right ankle X-rays were taken after patient complains of right ankle pain in PACU and found to have nondisplaced right lateral malleolus fracture and splint placed. medical history of hypertension, hyperlipidemia, GERD, CKD-2   OT comments  Pt seen for OT/PT co-treatment on this date. Upon arrival to room, pt awake and seated upright in bed. Pt agreeable to tx and able to verbalize NWB precautions prior to mobility. Pt required MAX A for lateral transfer recliner>bed via slide board  (+2 close by assist for safety), requiring verbal cues for sequencing. Pt with decreased awareness of deficits, stating that she felt that transfer was easy however requiring MAX A. When asked if pt's family could provide similar assistance at home, pt unable to confirm. While sitting EOB, pt observed to be incontinent of urine, requiring SUPERVISION/SET-UP to change hospital gown and perform anterior peri-care while seated EOB. Following return to supine, pt incontinent of urine again and provided set-up assistance for bed-level UB dressing and peri-care. Pt is making good progress toward goals and continues to benefit from skilled OT services to maximize return to PLOF and minimize risk of future falls, injury, caregiver burden, and readmission. Will continue to follow POC. Discharge recommendation of SNF remains appropriate.   Follow Up Recommendations  SNF    Equipment Recommendations  3 in 1 bedside commode       Precautions / Restrictions Precautions Precautions:  Fall Restrictions Weight Bearing Restrictions: Yes RLE Weight Bearing: Non weight bearing LLE Weight Bearing: Non weight bearing       Mobility Bed Mobility Overal bed mobility: Needs Assistance Bed Mobility: Sit to Supine;Rolling Rolling: Min assist   Supine to sit: Max assist Sit to supine: Max assist   General bed mobility comments: MAX A for LEs    Transfers Overall transfer level: Needs assistance Equipment used: Sliding board Transfers: Lateral/Scoot Transfers          Lateral/Scoot Transfers: Max assist;With slide board General transfer comment: second person close by for safety. patient needs increased assistance for return to bed. verbal cues for technique and sequencing using sliding board. patient required multiple seated rest breaks to complete tasks.    Balance Overall balance assessment: Needs assistance Sitting-balance support: Feet unsupported;Bilateral upper extremity supported Sitting balance-Leahy Scale: Good Sitting balance - Comments: good sitting balance at EOB during UB dressing                                   ADL either performed or assessed with clinical judgement   ADL       Grooming: Wash/dry hands;Supervision/safety;Set up;Sitting           Upper Body Dressing : Supervision/safety;Sitting Upper Body Dressing Details (indicate cue type and reason): to don/doff hospital gown x2 times         Toileting- Clothing Manipulation and Hygiene: Supervision/safety;Set up;Sitting/lateral lean Toileting - Clothing Manipulation Details (indicate cue type and reason): set-up for anterior peri-care following incontinence during mobility     Functional mobility during  ADLs: Maximal assistance (slide board transfer recliner>bed)                 Cognition Arousal/Alertness: Awake/alert Behavior During Therapy: WFL for tasks assessed/performed Overall Cognitive Status: Within Functional Limits for tasks assessed                                  General Comments: A&Ox4, however decreased awareness of deficits                   Pertinent Vitals/ Pain       Pain Assessment: No/denies pain Pain Score: 6  Pain Location: bilateral ankles Pain Descriptors / Indicators: Discomfort Pain Intervention(s): Ice applied;Monitored during session         Frequency  Min 2X/week        Progress Toward Goals  OT Goals(current goals can now be found in the care plan section)  Progress towards OT goals: Progressing toward goals  Acute Rehab OT Goals Patient Stated Goal: to go home with help from spouse OT Goal Formulation: With patient Time For Goal Achievement: 12/15/20 Potential to Achieve Goals: Fair  Plan Discharge plan remains appropriate;Frequency remains appropriate    Co-evaluation    PT/OT/SLP Co-Evaluation/Treatment: Yes Reason for Co-Treatment: To address functional/ADL transfers PT goals addressed during session: Mobility/safety with mobility OT goals addressed during session: ADL's and self-care      AM-PAC OT "6 Clicks" Daily Activity     Outcome Measure   Help from another person eating meals?: None Help from another person taking care of personal grooming?: A Little Help from another person toileting, which includes using toliet, bedpan, or urinal?: A Lot Help from another person bathing (including washing, rinsing, drying)?: A Lot Help from another person to put on and taking off regular upper body clothing?: A Little Help from another person to put on and taking off regular lower body clothing?: A Lot 6 Click Score: 16    End of Session Equipment Utilized During Treatment: Gait belt;Other (comment) (slide board)  OT Visit Diagnosis: Other abnormalities of gait and mobility (R26.89);History of falling (Z91.81);Pain Pain - Right/Left: Left Pain - part of body: Ankle and joints of foot;Hip   Activity Tolerance Patient tolerated treatment well   Patient Left  in bed;with call bell/phone within reach;with bed alarm set   Nurse Communication Mobility status        Time: 4010-2725 OT Time Calculation (min): 29 min  Charges: OT General Charges $OT Visit: 1 Visit OT Treatments $Self Care/Home Management : 8-22 mins  Matthew Folks, OTR/L ASCOM [phone]

## 2020-12-02 NOTE — [place] FL2 (Signed)
[place] MEDICAID FL2 LEVEL OF CARE SCREENING TOOL     IDENTIFICATION  Patient Name: Sarah Higgins Birthdate: 03-30-1935 Sex: female Admission Date (Current Location): 11/29/2020  [place] and [place] Number:  Chiropodist and Address:  [facility], [address], [place], [place] [address]      Provider Number: [id number]  Attending Physician Name and Address:  Marrion Coy, MD  Relative Name and Phone Number:  Leone Brand [phone]    Current Level of Care: Hospital Recommended Level of Care: Skilled Nursing Facility Prior Approval Number:    Date Approved/Denied:   PASRR Number: [id number]  Discharge Plan: SNF    Current Diagnoses: Patient Active Problem List   Diagnosis Date Noted  . Hypokalemia 11/30/2020  . Trimalleolar fracture of left ankle 11/29/2020  . HLD (hyperlipidemia) 11/29/2020  . Syncope 11/29/2020  . Fall 11/29/2020  . Hypomagnesemia 11/29/2020  . Closed left ankle fracture 11/29/2020  . CKD (chronic kidney disease), stage II 11/29/2020  . GERD (gastroesophageal reflux disease) 11/29/2020  . Leukocytosis 11/29/2020  . HYPERLIPIDEMIA 01/07/2010  . HTN (hypertension) 01/07/2010  . UPPER RESPIRATORY INFECTION, ACUTE 01/07/2010    Orientation RESPIRATION BLADDER Height & Weight     Self,Time,Situation,Place  Normal Continent Weight: 86 kg Height:  5\' 7"  (170.2 cm)  BEHAVIORAL SYMPTOMS/MOOD NEUROLOGICAL BOWEL NUTRITION STATUS      Continent Diet (regular)  AMBULATORY STATUS COMMUNICATION OF NEEDS Skin   Extensive Assist Verbally Surgical wounds                       Personal Care Assistance Level of Assistance  Dressing,Bathing Bathing Assistance: Limited assistance   Dressing Assistance: Limited assistance     Functional Limitations Info             SPECIAL CARE FACTORS FREQUENCY  PT (By licensed PT)     PT Frequency: 5 times per week              Contractures Contractures  Info: Not present    Additional Factors Info  Code Status,Allergies Code Status Info: full Allergies Info: NKDA           Current Medications (12/02/2020):  This is the current hospital active medication list Current Facility-Administered Medications  Medication Dose Route Frequency Provider Last Rate Last Admin  . acetaminophen (TYLENOL) tablet 650 mg  650 mg Oral Q6H PRN 12/04/2020, MD   650 mg at 12/01/20 0004  . alum & mag hydroxide-simeth (MAALOX/MYLANTA) 200-200-20 MG/5ML suspension 30 mL  30 mL Oral Q4H PRN 01-18-2001, MD      . benzonatate (TESSALON) capsule 100 mg  100 mg Oral TID PRN Juanell Fairly, MD      . bisacodyl (DULCOLAX) suppository 10 mg  10 mg Rectal Daily PRN Juanell Fairly, MD      . Chlorhexidine Gluconate Cloth 2 % PADS 6 each  6 each Topical Q0600 Juanell Fairly, MD   6 each at 12/02/20 0522  . docusate sodium (COLACE) capsule 100 mg  100 mg Oral BID 12/04/20, MD   100 mg at 12/02/20 0801  . enoxaparin (LOVENOX) injection 40 mg  40 mg Subcutaneous Q24H 12/04/20, MD   40 mg at 12/02/20 0758  . fluticasone (FLONASE) 50 MCG/ACT nasal spray 1 spray  1 spray Each Nare Daily PRN 12/04/20, MD      . gabapentin (NEURONTIN) capsule 200 mg  200 mg Oral TID Juanell Fairly, MD  200 mg at 12/02/20 0802  . hydrALAZINE (APRESOLINE) injection 5 mg  5 mg Intravenous Q2H PRN Juanell Fairly, MD      . HYDROcodone-acetaminophen (NORCO/VICODIN) 5-325 MG per tablet 1-2 tablet  1-2 tablet Oral Q4H PRN Juanell Fairly, MD   1 tablet at 12/02/20 0801  . latanoprost (XALATAN) 0.005 % ophthalmic solution 1 drop  1 drop Both Eyes QHS Juanell Fairly, MD   1 drop at 12/01/20 2131  . loperamide (IMODIUM) capsule 4 mg  4 mg Oral BID PRN Juanell Fairly, MD      . loratadine (CLARITIN) tablet 10 mg  10 mg Oral Daily Juanell Fairly, MD   10 mg at 12/02/20 0801  . magnesium citrate solution 1 Bottle  1 Bottle Oral Once PRN Juanell Fairly, MD       . menthol-cetylpyridinium (CEPACOL) lozenge 3 mg  1 lozenge Oral PRN Juanell Fairly, MD       Or  . phenol (CHLORASEPTIC) mouth spray 1 spray  1 spray Mouth/Throat PRN Juanell Fairly, MD      . methocarbamol (ROBAXIN) tablet 500 mg  500 mg Oral Q6H PRN Juanell Fairly, MD   500 mg at 12/02/20 1245   Or  . methocarbamol (ROBAXIN) 500 mg in dextrose 5 % 50 mL IVPB  500 mg Intravenous Q6H PRN Juanell Fairly, MD      . morphine 2 MG/ML injection 0.5-1 mg  0.5-1 mg Intravenous Q2H PRN Juanell Fairly, MD      . multivitamin with minerals tablet 1 tablet  1 tablet Oral Daily Marrion Coy, MD   1 tablet at 12/02/20 0801  . mupirocin ointment (BACTROBAN) 2 % 1 application  1 application Nasal BID Juanell Fairly, MD   1 application at 12/02/20 0757  . ondansetron (ZOFRAN) tablet 4 mg  4 mg Oral Q6H PRN Juanell Fairly, MD   4 mg at 12/02/20 0431   Or  . ondansetron [facility]) injection 4 mg  4 mg Intravenous Q6H PRN Juanell Fairly, MD      . oxybutynin (DITROPAN-XL) 24 hr tablet 10 mg  10 mg Oral Daily Juanell Fairly, MD   10 mg at 12/02/20 0757  . pantoprazole (PROTONIX) EC tablet 40 mg  40 mg Oral Daily Juanell Fairly, MD   40 mg at 12/02/20 0803  . polyethylene glycol (MIRALAX / GLYCOLAX) packet 17 g  17 g Oral Daily PRN Juanell Fairly, MD   17 g at 12/02/20 0520  . pravastatin (PRAVACHOL) tablet 20 mg  20 mg Oral q1800 Juanell Fairly, MD   20 mg at 12/01/20 1641  . protein supplement (ENSURE MAX) liquid  11 oz Oral Daily Marrion Coy, MD   11 oz at 12/01/20 1520  . senna (SENOKOT) tablet 8.6 mg  1 tablet Oral BID Juanell Fairly, MD   8.6 mg at 12/02/20 0801  . senna-docusate (Senokot-S) tablet 1 tablet  1 tablet Oral QHS PRN Juanell Fairly, MD      . traMADol Janean Sark) tablet 50 mg  50 mg Oral Q6H Juanell Fairly, MD   50 mg at 12/02/20 0520  . venlafaxine XR (EFFEXOR-XR) 24 hr capsule 150 mg  150 mg Oral Daily Juanell Fairly, MD   150 mg at 12/02/20 8099     Discharge  Medications: Please see discharge summary for a list of discharge medications.  Relevant Imaging Results:  Relevant Lab Results:   Additional Information SS# [id number]  Barrie Dunker, RN

## 2020-12-02 NOTE — TOC Progression Note (Addendum)
Transition of Care [facility]) - Progression Note    Patient Details  Name: Sarah Higgins MRN: [id number] Date of Birth: [birth year]  Transition of Care [facility]) CM/SW Contact  Barrie Dunker, RN Phone Number: 12/02/2020, 2:47 PM  Clinical Narrative:    Spoke with the patient and reviewed the bed choices, she stated out of the options she would prefer [facility], and agreed that I start the insurance auth approval process, I notified Tammy at [facility] of the accepting of the bed and started ins auth with [facility], ref number [id number]        Expected Discharge Plan and Services                                                 Social Determinants of Health (SDOH) Interventions    Readmission Risk Interventions No flowsheet data found.

## 2020-12-02 NOTE — Care Management Important Message (Signed)
Important Message  Patient Details  Name: Sarah Higgins MRN: [id number] Date of Birth: 10-10-1934   Medicare Important Message Given:  Yes     Olegario Messier A Tarren Sabree 12/02/2020, 2:50 PM

## 2020-12-02 NOTE — TOC Progression Note (Signed)
Transition of Care [facility]) - Progression Note    Patient Details  Name: Sarah Higgins MRN: [id number] Date of Birth: 1934-10-15  Transition of Care [facility]) CM/SW Contact  Barrie Dunker, RN Phone Number: 12/02/2020, 11:22 AM  Clinical Narrative:    Spoke with the patient and explained I was no table to set up [facility] for her, she is agreeing to a bedsearch, PASSR obtained, Fl2 completed, bedsearch sent        Expected Discharge Plan and Services                                                 Social Determinants of Health (SDOH) Interventions    Readmission Risk Interventions No flowsheet data found.

## 2020-12-02 NOTE — Progress Notes (Addendum)
Physical Therapy Treatment Patient Details Name: Sarah Higgins MRN: [id number] DOB: September 11, 1934 Today's Date: 12/02/2020    History of Present Illness Patient is a 86 y.o. female who sustained an injury to her left ankle during a syncopal episode at home today.  Patient was noted to have deformity to the left ankle after her fall and found to have left closed trimalleolar ankle fracture dislocation. Status post ORIF of left ankle. Right ankle X-rays were taken after patient complains of right ankle pain in PACU and found to have nondisplaced right lateral malleolus fracture and splint placed. medical history of hypertension, hyperlipidemia, GERD, CKD-2    PT Comments    Returned to assist patient back to bed from previous session this morning. Patient required increased assistance with sliding board transfer from recliner chair with drop arm to bed (slight height difference and going against gravity). Patient required multiple rest breaks during the transfer due to fatigue with activity. Continue to recommend SNF placement. PT will continue to follow.     Follow Up Recommendations  SNF     Equipment Recommendations   (wheelchair with elevated leg rests, drop arm bed side commode, sliding board (recommended if going home))    Recommendations for Other Services       Precautions / Restrictions Precautions Precautions: Fall Restrictions Weight Bearing Restrictions: Yes RLE Weight Bearing: Non weight bearing LLE Weight Bearing: Non weight bearing    Mobility  Bed Mobility Overal bed mobility: Needs Assistance       Supine to sit: Max assist     General bed mobility comments: not addressed    Transfers Overall transfer level: Needs assistance Equipment used: Sliding board Transfers: Lateral/Scoot Transfers          Lateral/Scoot Transfers: Max assist;With slide board General transfer comment: second person close by for safety. patient needs increased assistance for  return to bed. verbal cues for technique and sequencing using sliding board. patient required multiple seated rest breaks to complete tasks. patient sitting up on edge of bed with OT at end of co-treat session  Ambulation/Gait                 Stairs             Wheelchair Mobility    Modified Rankin (Stroke Patients Only)       Balance Overall balance assessment: Needs assistance Sitting-balance support: Feet unsupported;Bilateral upper extremity supported Sitting balance-Leahy Scale: Good                                      Cognition Arousal/Alertness: Awake/alert Behavior During Therapy: WFL for tasks assessed/performed Overall Cognitive Status: Within Functional Limits for tasks assessed                                        Exercises      General Comments        Pertinent Vitals/Pain Pain Assessment: No/denies pain Pain Score: 6  Pain Location: bilateral ankles Pain Descriptors / Indicators: Discomfort Pain Intervention(s): Ice applied;Monitored during session    Home Living                      Prior Function            PT Goals (current goals can now  be found in the care plan section) Acute Rehab PT Goals Patient Stated Goal: to go home with help from spouse PT Goal Formulation: With patient Time For Goal Achievement: 12/15/20 Potential to Achieve Goals: Good Progress towards PT goals: Progressing toward goals    Frequency    7X/week      PT Plan Current plan remains appropriate    Co-evaluation PT/OT/SLP Co-Evaluation/Treatment: Yes Reason for Co-Treatment: To address functional/ADL transfers PT goals addressed during session: Mobility/safety with mobility        AM-PAC PT "6 Clicks" Mobility   Outcome Measure  Help needed turning from your back to your side while in a flat bed without using bedrails?: A Little Help needed moving from lying on your back to sitting on the side of  a flat bed without using bedrails?: A Little Help needed moving to and from a bed to a chair (including a wheelchair)?: A Lot Help needed standing up from a chair using your arms (e.g., wheelchair or bedside chair)?: Total Help needed to walk in hospital room?: Total Help needed climbing 3-5 steps with a railing? : Total 6 Click Score: 11    End of Session   Activity Tolerance: Patient limited by fatigue Patient left:  (sitting up on edge of bed) Nurse Communication: Mobility status PT Visit Diagnosis: Difficulty in walking, not elsewhere classified (R26.2);Muscle weakness (generalized) (M62.81)     Time: 1610-9604 PT Time Calculation (min) (ACUTE ONLY): 17 min  Charges:  $Therapeutic Activity: 8-22 mins                     Donna Bernard, PT, MPT    Sarah Higgins 12/02/2020, 12:55 PM

## 2020-12-02 NOTE — Progress Notes (Signed)
Physical Therapy Treatment Patient Details Name: Sarah Higgins MRN: [id number] DOB: [birth year] Today's Date: 12/02/2020    History of Present Illness Patient is a 86 y.o. female who sustained an injury to her left ankle during a syncopal episode at home today.  Patient was noted to have deformity to the left ankle after her fall and found to have left closed trimalleolar ankle fracture dislocation. Status post ORIF of left ankle. Right ankle X-rays were taken after patient complains of right ankle pain in PACU and found to have nondisplaced right lateral malleolus fracture and splint placed. medical history of hypertension, hyperlipidemia, GERD, CKD-2    PT Comments    Patient making progress with progression of functional activity. Patient was able to get to the recliner chair today using the sliding board with moderate assistance. She is able to maintain weight bearing status with activity. Patient continues to require assistance with all mobility. Although patient wants to go home at discharge, SNF is the safest plan for ongoing PT efforts to maximize independence before going home with family support.     Follow Up Recommendations  SNF     Equipment Recommendations  Wheelchair (measurements PT) (wheelchair with elevated leg rests, drop arm bed side commode, sliding board (recommended if going home))    Recommendations for Other Services       Precautions / Restrictions Precautions Precautions: Fall Restrictions Weight Bearing Restrictions: Yes RLE Weight Bearing: Non weight bearing LLE Weight Bearing: Non weight bearing    Mobility  Bed Mobility Overal bed mobility: Needs Assistance       Supine to sit: Max assist     General bed mobility comments: assistance for LE and trunk support. verbal cues for technique    Transfers Overall transfer level: Needs assistance Equipment used: Sliding board Transfers: Lateral/Scoot Transfers          Lateral/Scoot Transfers:  Mod assist;With slide board General transfer comment: incremental scooting performed on sliding board from bed to recliner chair with drop arm. Mod A required. maximal verbal cues for technique. patient was able to maintain NWB of BLE with transfer. patient has mild audible wheezing after transfer. Sp02 98% on room air  Ambulation/Gait                 Stairs             Wheelchair Mobility    Modified Rankin (Stroke Patients Only)       Balance Overall balance assessment: Needs assistance Sitting-balance support: Feet unsupported;Bilateral upper extremity supported Sitting balance-Leahy Scale: Good                                      Cognition Arousal/Alertness: Awake/alert Behavior During Therapy: WFL for tasks assessed/performed Overall Cognitive Status: Within Functional Limits for tasks assessed                                        Exercises      General Comments        Pertinent Vitals/Pain Pain Assessment: 0-10 Pain Score: 6  Pain Location: bilateral ankles Pain Descriptors / Indicators: Discomfort Pain Intervention(s): Ice applied;Monitored during session    Home Living                      Prior  Function            PT Goals (current goals can now be found in the care plan section) Acute Rehab PT Goals Patient Stated Goal: to go home with help from spouse PT Goal Formulation: With patient Time For Goal Achievement: 12/15/20 Potential to Achieve Goals: Good Progress towards PT goals: Progressing toward goals    Frequency    7X/week      PT Plan Current plan remains appropriate    Co-evaluation              AM-PAC PT "6 Clicks" Mobility   Outcome Measure  Help needed turning from your back to your side while in a flat bed without using bedrails?: A Little Help needed moving from lying on your back to sitting on the side of a flat bed without using bedrails?: A Little Help  needed moving to and from a bed to a chair (including a wheelchair)?: A Lot Help needed standing up from a chair using your arms (e.g., wheelchair or bedside chair)?: Total Help needed to walk in hospital room?: Total Help needed climbing 3-5 steps with a railing? : Total 6 Click Score: 11    End of Session   Activity Tolerance: Patient limited by fatigue Patient left: in chair;with call bell/phone within reach (LE elevated) Nurse Communication: Mobility status PT Visit Diagnosis: Difficulty in walking, not elsewhere classified (R26.2);Muscle weakness (generalized) (M62.81)     Time: 1610-9604 PT Time Calculation (min) (ACUTE ONLY): 36 min  Charges:  $Therapeutic Activity: 23-37 mins                     Donna Bernard, PT, MPT    Ina Homes 12/02/2020, 12:47 PM

## 2020-12-02 NOTE — Progress Notes (Signed)
PROGRESS NOTE    Sarah Higgins  ASN:[id number] DOB: [birth year] DOA: 11/29/2020 PCP: Patient, No Pcp Per (Inactive)   Chief complaint: bilateral ankle pain Brief Narrative:  Sarah Higgins a 86 y.o.femalewith medical history significant ofhypertension, hyperlipidemia, GERD, CKD-2,who presents with syncope, fall and left ankle pain.X-ray showed left ankle fracture. EKG sinus, no significant prolongation of QTc.Troponin negative. Patient is seen by orthopedics, patient has nondisplaced right ankle fracture, and displaced left ankle fracture. ORIF of left ankle was performed on 5/22. Right ankle cast placed.    Assessment & Plan:   Principal Problem:   Trimalleolar fracture of left ankle Active Problems:   HTN (hypertension)   HLD (hyperlipidemia)   Syncope   Fall   Hypomagnesemia   CKD (chronic kidney disease), stage II   GERD (gastroesophageal reflux disease)   Leukocytosis   Hypokalemia  1. Syncope and collapse due to vasovago reaction. Tele without arrhythmia. Echo showed EF 60-65*, no significant valvular abnormality.  2. Bilateral ankle fracture. S/p left ORIF. Patient is non-weight bearing in both legs. Pending SNF.  3.  Hypomagnesemia. Hypokalemia. Improved  4. Essential hypertension  DVT prophylaxis: Lovenox Code Status: Full Family Communication:  Disposition Plan:  .   Status is: Inpatient  Remains inpatient appropriate because:Unsafe d/c plan   Dispo: The patient is from: Home              Anticipated d/c is to: SNF              Patient currently is medically stable to d/c.   Difficult to place patient No        I/O last 3 completed shifts: In: 660 [P.O.:360; IV Piggyback:300] Out: 850 [Urine:850] No intake/output data recorded.     Consultants:   Orthopedics  Procedures: Ankle  Antimicrobials: None  Subjective: Patient is doing well today. No significant pain. No fever or chills. No shortness of breath or cough No  abdominal pain or nausea   Objective: Vitals:   12/01/20 1946 12/02/20 0437 12/02/20 0745 12/02/20 1159  BP: 126/71 124/60 121/66 114/60  Pulse: 72 82 79 79  Resp: 20 (!) 22 17 17   Temp: 98.2 F (36.8 C) 98.1 F (36.7 C) 97.9 F (36.6 C) 98.6 F (37 C)  TempSrc: Oral Oral Oral Oral  SpO2: 97% 99% 98% 99%  Weight:      Height:        Intake/Output Summary (Last 24 hours) at 12/02/2020 1302 Last data filed at 12/02/2020 0445 Gross per 24 hour  Intake 220 ml  Output 600 ml  Net -380 ml   Filed Weights   11/29/20 1507  Weight: 86 kg    Examination:  General exam: Appears calm and comfortable  Respiratory system: Clear to auscultation. Respiratory effort normal. Cardiovascular system: S1 & S2 heard, RRR. No JVD, murmurs, rubs, gallops or clicks. No pedal edema. Gastrointestinal system: Abdomen is nondistended, soft and nontender. No organomegaly or masses felt. Normal bowel sounds heard. Central nervous system: Alert and oriented x3. No focal neurological deficits. Extremities: Symmetric 5 x 5 power. Skin: No rashes, lesions or ulcers Psychiatry: Judgement and insight appear normal. Mood & affect appropriate.     Data Reviewed: I have personally reviewed following labs and imaging studies  CBC: Recent Labs  Lab 11/29/20 1509 11/30/20 0527 12/01/20 0509  WBC 17.2* 13.0* 18.3*  NEUTROABS 10.5*  --  13.5*  HGB 12.8 12.0 11.0*  HCT 39.7 37.3 32.8*  MCV 84.3 84.2 83.9  PLT  236 182 161   Basic Metabolic Panel: Recent Labs  Lab 11/29/20 1509 11/30/20 0527 12/01/20 0509  NA 138 134* 136  K 3.5 3.4* 4.3  CL 102 97* 102  CO2 28 26 26   GLUCOSE 99 112* 141*  BUN 15 16 20   CREATININE 1.06* 0.78 0.84  CALCIUM 9.2 9.0 9.2  MG 1.6*  --  1.6*   GFR: Estimated Creatinine Clearance: 56.2 mL/min (by C-G formula based on SCr of 0.84 mg/dL). Liver Function Tests: Recent Labs  Lab 11/29/20 1509  AST 20  ALT 16  ALKPHOS 83  BILITOT 0.7  PROT 7.7  ALBUMIN 3.8    Recent Labs  Lab 11/29/20 1509  LIPASE 73*   No results for input(s): AMMONIA in the last 168 hours. Coagulation Profile: Recent Labs  Lab 11/29/20 1654  INR 1.1   Cardiac Enzymes: No results for input(s): CKTOTAL, CKMB, CKMBINDEX, TROPONINI in the last 168 hours. BNP (last 3 results) No results for input(s): PROBNP in the last 8760 hours. HbA1C: No results for input(s): HGBA1C in the last 72 hours. CBG: Recent Labs  Lab 11/29/20 1511 12/01/20 2121  GLUCAP 101* 109*   Lipid Profile: No results for input(s): CHOL, HDL, LDLCALC, TRIG, CHOLHDL, LDLDIRECT in the last 72 hours. Thyroid Function Tests: No results for input(s): TSH, T4TOTAL, FREET4, T3FREE, THYROIDAB in the last 72 hours. Anemia Panel: No results for input(s): VITAMINB12, FOLATE, FERRITIN, TIBC, IRON, RETICCTPCT in the last 72 hours. Sepsis Labs: No results for input(s): PROCALCITON, LATICACIDVEN in the last 168 hours.  Recent Results (from the past 240 hour(s))  Resp Panel by RT-PCR (Flu A&B, Covid) Nasopharyngeal Swab     Status: None   Collection Time: 11/29/20  3:09 PM   Specimen: Nasopharyngeal Swab; Nasopharyngeal(NP) swabs in vial transport medium  Result Value Ref Range Status   SARS Coronavirus 2 by RT PCR NEGATIVE NEGATIVE Final    Comment: (NOTE) SARS-CoV-2 target nucleic acids are NOT DETECTED.  The SARS-CoV-2 RNA is generally detectable in upper respiratory specimens during the acute phase of infection. The lowest concentration of SARS-CoV-2 viral copies this assay can detect is 138 copies/mL. A negative result does not preclude SARS-Cov-2 infection and should not be used as the sole basis for treatment or other patient management decisions. A negative result may occur with  improper specimen collection/handling, submission of specimen other than nasopharyngeal swab, presence of viral mutation(s) within the areas targeted by this assay, and inadequate number of viral copies(<138  copies/mL). A negative result must be combined with clinical observations, patient history, and epidemiological information. The expected result is Negative.  Fact Sheet for Patients:  BloggerCourse.comhttps://www.fda.gov/media/152166/download  Fact Sheet for Healthcare Providers:  SeriousBroker.ithttps://www.fda.gov/media/152162/download  This test is no t yet approved or cleared by the [place] FDA and  has been authorized for detection and/or diagnosis of SARS-CoV-2 by FDA under an Emergency Use Authorization (EUA). This EUA will remain  in effect (meaning this test can be used) for the duration of the COVID-19 declaration under Section 564(b)(1) of the Act, 21 U.S.C.section 360bbb-3(b)(1), unless the authorization is terminated  or revoked sooner.       Influenza A by PCR NEGATIVE NEGATIVE Final   Influenza B by PCR NEGATIVE NEGATIVE Final    Comment: (NOTE) The Xpert Xpress SARS-CoV-2/FLU/RSV plus assay is intended as an aid in the diagnosis of influenza from Nasopharyngeal swab specimens and should not be used as a sole basis for treatment. Nasal washings and aspirates are unacceptable for Xpert  Xpress SARS-CoV-2/FLU/RSV testing.  Fact Sheet for Patients: BloggerCourse.com  Fact Sheet for Healthcare Providers: SeriousBroker.it  This test is not yet approved or cleared by the [place] FDA and has been authorized for detection and/or diagnosis of SARS-CoV-2 by FDA under an Emergency Use Authorization (EUA). This EUA will remain in effect (meaning this test can be used) for the duration of the COVID-19 declaration under Section 564(b)(1) of the Act, 21 U.S.C. section 360bbb-3(b)(1), unless the authorization is terminated or revoked.  Performed at [facility], [address], [place], [place] [address]   Surgical pcr screen     Status: Abnormal   Collection Time: 11/29/20  6:51 PM   Specimen: Nasal Mucosa; Nasal Swab  Result  Value Ref Range Status   MRSA, PCR POSITIVE (A) NEGATIVE Final   Staphylococcus aureus POSITIVE (A) NEGATIVE Final    Comment: RESULT CALLED TO, READ BACK BY AND VERIFIED WITH: MATTHEW WRIGHT @ 2031 11/29/2020 (NOTE) The Xpert SA Assay (FDA approved for NASAL specimens in patients 62 years of age and older), is one component of a comprehensive surveillance program. It is not intended to diagnose infection nor to guide or monitor treatment. Performed at [facility], [address] [place], [place] [address]          Radiology Studies: X-ray chest PA or AP  Result Date: 11/30/2020 CLINICAL DATA:  Vomiting.  Assess for aspiration EXAM: CHEST  1 VIEW COMPARISON:  None. FINDINGS: Airspace disease seen in the right upper lobe, most predominantly platelike. Left base atelectasis. Heart is normal size. No effusions. IMPRESSION: Airspace disease in the right upper lobe predominantly is platelike suggesting atelectasis although airspace disease elsewhere in the right upper lobe could reflect infiltrate. Left base atelectasis. Electronically Signed   By: Charlett Nose M.D.   On: 11/30/2020 13:34   DG Ankle Complete Left  Result Date: 11/30/2020 CLINICAL DATA:  ORIF EXAM: LEFT ANKLE COMPLETE - 3+ VIEW COMPARISON:  None. FINDINGS: Plate and screw fixation within the distal fibula. Screw seen within the medial malleolus. Anatomic alignment. No hardware complicating feature. IMPRESSION: Internal fixation.  No visible complicating feature. Electronically Signed   By: Charlett Nose M.D.   On: 11/30/2020 14:44   X-ray ankle right AP lateral and oblique  Result Date: 11/30/2020 CLINICAL DATA:  Acute right ankle pain and swelling after fall yesterday. EXAM: RIGHT ANKLE - COMPLETE 3+ VIEW COMPARISON:  None. FINDINGS: Nondisplaced oblique fracture is seen involving the distal right fibula. Soft tissue swelling is seen over the lateral malleolus. Joint spaces are intact. IMPRESSION: Nondisplaced  distal right fibular fracture. Electronically Signed   By: Lupita Raider M.D.   On: 11/30/2020 15:07   DG HIP UNILAT WITH PELVIS 2-3 VIEWS LEFT  Result Date: 12/01/2020 CLINICAL DATA:  left hip pain since fall. EXAM: DG HIP (WITH OR WITHOUT PELVIS) 2-3V LEFT COMPARISON:  None. FINDINGS: AP view of the pelvis and AP views of the left hip. Femoral heads are located. No acute fracture. Left greater than right sacroiliac joint sclerosis is favored to be degenerative. Advanced lumbosacral junction spondylosis, suboptimally evaluated. IMPRESSION: No acute osseous abnormality. Electronically Signed   By: Jeronimo Greaves M.D.   On: 12/01/2020 13:51   ECHOCARDIOGRAM LIMITED  Result Date: 12/01/2020    ECHOCARDIOGRAM LIMITED REPORT   Patient Name:   Sarah Higgins Date of Exam: 12/01/2020 Medical Rec #:  [id number]     Height:       67.0 in Accession #:  [id number]    Weight:       189.6 lb Date of Birth:  March 25, 1935     BSA:          1.977 m Patient Age:    86 years      BP:           104/67 mmHg Patient Gender: F             HR:           73 bpm. Exam Location:  [facility] Procedure: Limited Echo, Limited Color Doppler and Cardiac Doppler Indications:     R55 Syncope  History:         Patient has no prior history of Echocardiogram examinations.                  Risk Factors:Hypertension and Dyslipidemia.  Sonographer:     Humphrey Rolls RDCS (AE) Referring Phys:  [id number] Juanell Fairly Diagnosing Phys: Julien Nordmann MD IMPRESSIONS  1. Left ventricular ejection fraction, by estimation, is 60 to 65%. The left ventricle has normal function. The left ventricle has no regional wall motion abnormalities. Left ventricular diastolic parameters are indeterminate.  2. Right ventricular systolic function is normal. The right ventricular size is normal. Tricuspid regurgitation signal is inadequate for assessing PA pressure.  3. The mitral valve was not well visualized. Mild mitral valve regurgitation.  4. The aortic valve was not well  visualized. Aortic valve regurgitation is mild. Mild aortic valve sclerosis is present. No aortic valve gradient measured FINDINGS  Left Ventricle: Left ventricular ejection fraction, by estimation, is 60 to 65%. The left ventricle has normal function. The left ventricle has no regional wall motion abnormalities. The left ventricular internal cavity size was normal in size. There is  no left ventricular hypertrophy. Left ventricular diastolic parameters are indeterminate. Right Ventricle: The right ventricular size is normal. No increase in right ventricular wall thickness. Right ventricular systolic function is normal. Tricuspid regurgitation signal is inadequate for assessing PA pressure. Left Atrium: Left atrial size was normal in size. Right Atrium: Right atrial size was normal in size. Pericardium: There is no evidence of pericardial effusion. Mitral Valve: The mitral valve was not well visualized. Mild mitral valve regurgitation. No evidence of mitral valve stenosis. Tricuspid Valve: The tricuspid valve is normal in structure. Tricuspid valve regurgitation is not demonstrated. No evidence of tricuspid stenosis. Aortic Valve: The aortic valve was not well visualized. Aortic valve regurgitation is mild. Mild aortic valve sclerosis is present, with no evidence of aortic valve stenosis. Pulmonic Valve: The pulmonic valve was not well visualized. Pulmonic valve regurgitation is not visualized. No evidence of pulmonic stenosis. Aorta: The aortic root is normal in size and structure. Venous: The pulmonary veins were not well visualized. The inferior vena cava is normal in size with greater than 50% respiratory variability, suggesting right atrial pressure of 3 mmHg. IAS/Shunts: No atrial level shunt detected by color flow Doppler. LEFT VENTRICLE PLAX 2D LVIDd:         3.40 cm LVIDs:         2.00 cm LV PW:         0.90 cm LV IVS:        0.70 cm  LEFT ATRIUM         Index LA diam:    1.80 cm 0.91 cm/m Julien Nordmann  MD Electronically signed by Julien Nordmann MD Signature Date/Time: 12/01/2020/8:47:48 AM    Final  Scheduled Meds: . Chlorhexidine Gluconate Cloth  6 each Topical Q0600  . docusate sodium  100 mg Oral BID  . enoxaparin (LOVENOX) injection  40 mg Subcutaneous Q24H  . gabapentin  200 mg Oral TID  . latanoprost  1 drop Both Eyes QHS  . loratadine  10 mg Oral Daily  . multivitamin with minerals  1 tablet Oral Daily  . mupirocin ointment  1 application Nasal BID  . oxybutynin  10 mg Oral Daily  . pantoprazole  40 mg Oral Daily  . pravastatin  20 mg Oral q1800  . Ensure Max Protein  11 oz Oral Daily  . senna  1 tablet Oral BID  . traMADol  50 mg Oral Q6H  . venlafaxine XR  150 mg Oral Daily   Continuous Infusions: . methocarbamol (ROBAXIN) IV       LOS: 3 days    Time spent: 28 minutes    Marrion Coy, MD [facility]   To contact the attending provider between 7A-7P or the covering provider during after hours 7P-7A, please log into the web site [email or web address] and access using universal [place] password for that web site. If you do not have the password, please call the hospital operator.  12/02/2020, 1:02 PM

## 2020-12-02 NOTE — Progress Notes (Signed)
Subjective:  POD #2 s/p ORIF of left trimalleolar ankle fracture and splinting of right lateral malleolus fracture.   Patient was seen earlier today at approximately 1 PM and was doing well at that time.  I returned this evening to put her right leg in a cast in preparation for going to skilled nursing facility possibly tomorrow.  Patient's husband is at the bedside.  Patient reports bilateral ankle pain as mild.  Patient states she was able to get up out of bed to a chair today.  Objective:   VITALS:   Vitals:   12/02/20 0437 12/02/20 0745 12/02/20 1159 12/02/20 1546  BP: 124/60 121/66 114/60 111/65  Pulse: 82 79 79 81  Resp: (!) 22 17 17 17   Temp: 98.1 F (36.7 C) 97.9 F (36.6 C) 98.6 F (37 C) 97.9 F (36.6 C)  TempSrc: Oral Oral Oral Oral  SpO2: 99% 98% 99% 95%  Weight:      Height:        PHYSICAL EXAM: Left lower extremity: Splint is intact and bandages clean and dry. Patient can flex and extend her toes and has intact sensation to light touch in all 5 toes and her toes are well-perfused.  Right lower extremity: Patient splint was removed.  Patient skin is intact.  She has focal lateral ankle swelling with mild tenderness over the lateral malleolus without deformity.  Patient is neurovascular intact.   LABS  Results for orders placed or performed during the hospital encounter of 11/29/20 (from the past 24 hour(s))  Glucose, capillary     Status: Abnormal   Collection Time: 12/01/20  9:21 PM  Result Value Ref Range   Glucose-Capillary 109 (H) 70 - 99 mg/dL   Comment 1 Notify RN   Resp Panel by RT-PCR (Flu A&B, Covid) Nasopharyngeal Swab     Status: None   Collection Time: 12/02/20  2:45 PM   Specimen: Nasopharyngeal Swab; Nasopharyngeal(NP) swabs in vial transport medium  Result Value Ref Range   SARS Coronavirus 2 by RT PCR NEGATIVE NEGATIVE   Influenza A by PCR NEGATIVE NEGATIVE   Influenza B by PCR NEGATIVE NEGATIVE    DG HIP UNILAT WITH PELVIS 2-3 VIEWS  LEFT  Result Date: 12/01/2020 CLINICAL DATA:  left hip pain since fall. EXAM: DG HIP (WITH OR WITHOUT PELVIS) 2-3V LEFT COMPARISON:  None. FINDINGS: AP view of the pelvis and AP views of the left hip. Femoral heads are located. No acute fracture. Left greater than right sacroiliac joint sclerosis is favored to be degenerative. Advanced lumbosacral junction spondylosis, suboptimally evaluated. IMPRESSION: No acute osseous abnormality. Electronically Signed   By: 12/03/2020 M.D.   On: 12/01/2020 13:51   ECHOCARDIOGRAM LIMITED  Result Date: 12/01/2020    ECHOCARDIOGRAM LIMITED REPORT   Patient Name:   MADALINA ROSMAN Date of Exam: 12/01/2020 Medical Rec #:  12/03/2020     Height:       67.0 in Accession #:    [id number]    Weight:       189.6 lb Date of Birth:  [birth year]     BSA:          1.977 m Patient Age:    86 years      BP:           104/67 mmHg Patient Gender: F             HR:           73 bpm. Exam Location:  [facility] Procedure: Limited Echo, Limited Color Doppler and Cardiac Doppler Indications:     R55 Syncope  History:         Patient has no prior history of Echocardiogram examinations.                  Risk Factors:Hypertension and Dyslipidemia.  Sonographer:     Humphrey Rolls RDCS (AE) Referring Phys:  [id number] Juanell Fairly Diagnosing Phys: Julien Nordmann MD IMPRESSIONS  1. Left ventricular ejection fraction, by estimation, is 60 to 65%. The left ventricle has normal function. The left ventricle has no regional wall motion abnormalities. Left ventricular diastolic parameters are indeterminate.  2. Right ventricular systolic function is normal. The right ventricular size is normal. Tricuspid regurgitation signal is inadequate for assessing PA pressure.  3. The mitral valve was not well visualized. Mild mitral valve regurgitation.  4. The aortic valve was not well visualized. Aortic valve regurgitation is mild. Mild aortic valve sclerosis is present. No aortic valve gradient measured FINDINGS  Left  Ventricle: Left ventricular ejection fraction, by estimation, is 60 to 65%. The left ventricle has normal function. The left ventricle has no regional wall motion abnormalities. The left ventricular internal cavity size was normal in size. There is  no left ventricular hypertrophy. Left ventricular diastolic parameters are indeterminate. Right Ventricle: The right ventricular size is normal. No increase in right ventricular wall thickness. Right ventricular systolic function is normal. Tricuspid regurgitation signal is inadequate for assessing PA pressure. Left Atrium: Left atrial size was normal in size. Right Atrium: Right atrial size was normal in size. Pericardium: There is no evidence of pericardial effusion. Mitral Valve: The mitral valve was not well visualized. Mild mitral valve regurgitation. No evidence of mitral valve stenosis. Tricuspid Valve: The tricuspid valve is normal in structure. Tricuspid valve regurgitation is not demonstrated. No evidence of tricuspid stenosis. Aortic Valve: The aortic valve was not well visualized. Aortic valve regurgitation is mild. Mild aortic valve sclerosis is present, with no evidence of aortic valve stenosis. Pulmonic Valve: The pulmonic valve was not well visualized. Pulmonic valve regurgitation is not visualized. No evidence of pulmonic stenosis. Aorta: The aortic root is normal in size and structure. Venous: The pulmonary veins were not well visualized. The inferior vena cava is normal in size with greater than 50% respiratory variability, suggesting right atrial pressure of 3 mmHg. IAS/Shunts: No atrial level shunt detected by color flow Doppler. LEFT VENTRICLE PLAX 2D LVIDd:         3.40 cm LVIDs:         2.00 cm LV PW:         0.90 cm LV IVS:        0.70 cm  LEFT ATRIUM         Index LA diam:    1.80 cm 0.91 cm/m Julien Nordmann MD Electronically signed by Julien Nordmann MD Signature Date/Time: 12/01/2020/8:47:48 AM    Final     Assessment/Plan: 2 Days Post-Op    Principal Problem:   Trimalleolar fracture of left ankle Active Problems:   HTN (hypertension)   HLD (hyperlipidemia)   Syncope   Fall   Hypomagnesemia   CKD (chronic kidney disease), stage II   GERD (gastroesophageal reflux disease)   Leukocytosis   Hypokalemia  Patient doing well postop.  Patient will be nonweightbearing on her lower extremities.  She is bed to chair transfer but may perform physical therapy for knee range of motion and quad strengthening.  Patient will need  a wheelchair with leg supports.  Patient will need a skilled nursing facility upon discharge given her inability to bear weight on either lower extremity.  Procedure note:  I removed the patient's right leg posterior splint.  Patient had a stockinette applied to the right lower leg.  Webril was wrapped around the right lower extremity.  4 inch fiberglass tape x4 rolls was used to create a short leg cast.  Patient tolerated the casting procedure well.    Juanell Fairly , MD 12/02/2020, 7:36 PM

## 2020-12-02 NOTE — TOC Progression Note (Signed)
Transition of Care [facility]) - Progression Note    Patient Details  Name: Sarah Higgins MRN: [id number] Date of Birth: 09-23-1934  [facility] CM/SW Contact  Barrie Dunker, RN Phone Number: 12/02/2020, 10:05 AM  Clinical Narrative:   [facility], [facility], [facility], are all unable to accept the patient for [facility] services, notified the physician         Expected Discharge Plan and Services                                                 Social Determinants of Health (SDOH) Interventions    Readmission Risk Interventions No flowsheet data found.

## 2020-12-03 ENCOUNTER — Encounter: Payer: Self-pay | Admitting: Internal Medicine

## 2020-12-03 DIAGNOSIS — S82852D Displaced trimalleolar fracture of left lower leg, subsequent encounter for closed fracture with routine healing: Secondary | ICD-10-CM | POA: Diagnosis not present

## 2020-12-03 DIAGNOSIS — R55 Syncope and collapse: Secondary | ICD-10-CM | POA: Diagnosis not present

## 2020-12-03 LAB — BASIC METABOLIC PANEL
Anion gap: 9 (ref 5–15)
BUN: 17 mg/dL (ref 8–23)
CO2: 25 mmol/L (ref 22–32)
Calcium: 9.5 mg/dL (ref 8.9–10.3)
Chloride: 101 mmol/L (ref 98–111)
Creatinine, Ser: 0.59 mg/dL (ref 0.44–1.00)
GFR, Estimated: >60 mL/min (ref >60)
Glucose, Bld: 116 mg/dL — ABNORMAL HIGH (ref 70–99)
Potassium: 3.9 mmol/L (ref 3.5–5.1)
Sodium: 135 mmol/L (ref 135–145)

## 2020-12-03 LAB — CBC WITH DIFFERENTIAL/PLATELET
Abs Immature Granulocytes: 0.12 10*3/uL — ABNORMAL HIGH (ref 0.00–0.07)
Basophils Absolute: 0.1 10*3/uL (ref 0.0–0.1)
Basophils Relative: 0 %
Eosinophils Absolute: 0.4 10*3/uL (ref 0.0–0.5)
Eosinophils Relative: 3 %
HCT: 32.2 % — ABNORMAL LOW (ref 36.0–46.0)
Hemoglobin: 10.7 g/dL — ABNORMAL LOW (ref 12.0–15.0)
Immature Granulocytes: 1 %
Lymphocytes Relative: 26 %
Lymphs Abs: 3.8 10*3/uL (ref 0.7–4.0)
MCH: 27.6 pg (ref 26.0–34.0)
MCHC: 33.2 g/dL (ref 30.0–36.0)
MCV: 83.2 fL (ref 80.0–100.0)
Monocytes Absolute: 1.8 10*3/uL — ABNORMAL HIGH (ref 0.1–1.0)
Monocytes Relative: 12 %
Neutro Abs: 8.5 10*3/uL — ABNORMAL HIGH (ref 1.7–7.7)
Neutrophils Relative %: 58 %
Platelets: 188 10*3/uL (ref 150–400)
RBC: 3.87 MIL/uL (ref 3.87–5.11)
RDW: 13 % (ref 11.5–15.5)
WBC: 14.7 10*3/uL — ABNORMAL HIGH (ref 4.0–10.5)
nRBC: 0 % (ref 0.0–0.2)

## 2020-12-03 LAB — MAGNESIUM: Magnesium: 1.6 mg/dL — ABNORMAL LOW (ref 1.7–2.4)

## 2020-12-03 MED ORDER — ASPIRIN EC 325 MG PO TBEC: 325.0000 mg | DELAYED_RELEASE_TABLET | Freq: Every day | ORAL | 0 refills | Status: AC

## 2020-12-03 MED ORDER — SENNOSIDES-DOCUSATE SODIUM 8.6-50 MG PO TABS: 1.0000 | ORAL_TABLET | Freq: Every evening | ORAL | Status: AC | PRN

## 2020-12-03 MED ORDER — POLYETHYLENE GLYCOL 3350 17 G PO PACK: 17.0000 g | PACK | Freq: Every day | ORAL | Status: AC | PRN

## 2020-12-03 MED ORDER — HYDROCODONE-ACETAMINOPHEN 5-325 MG PO TABS: 1.0000 | ORAL_TABLET | Freq: Three times a day (TID) | ORAL | 0 refills | Status: DC | PRN

## 2020-12-03 MED ORDER — ACETAMINOPHEN 325 MG PO TABS: 650.0000 mg | ORAL_TABLET | Freq: Four times a day (QID) | ORAL | Status: AC | PRN

## 2020-12-03 MED ORDER — MAGNESIUM SULFATE 2 GM/50ML IV SOLN
2.0000 g | Freq: Once | INTRAVENOUS | Status: AC
  Administered 2020-12-03: 2 g via INTRAVENOUS
  Filled 2020-12-03: qty 50

## 2020-12-03 NOTE — Progress Notes (Signed)
Subjective:  POD #3 s/p ORIF of left ankle fracture with testing of right ankle fracture.   Patient reports bilateral ankle pain as mild.  Patient states she is not having any issues with the cast in place last night.  Objective:   VITALS:   Vitals:   12/02/20 1945 12/03/20 0415 12/03/20 0732 12/03/20 1135  BP: 130/61 (!) 119/50 (!) 125/58 113/68  Pulse: 81 79 76 73  Resp: 14 20 18  (!) 22  Temp: 99 F (37.2 C) 98.2 F (36.8 C) 98.8 F (37.1 C) 98.1 F (36.7 C)  TempSrc: Oral Oral Oral   SpO2: 99% 94% 95% 94%  Weight:      Height:        PHYSICAL EXAM: Bilateral lower extremity: Splint and dressing on the left lower extremity is clean dry and intact.  The Right Lower Extremity Is Also Clean and Dry.  Patient's Toes Are Well-Perfused.  She Can Flex and Extend All Digits of Both Feet and she has intact sensation light touch.   LABS  Results for orders placed or performed during the hospital encounter of 11/29/20 (from the past 24 hour(s))  Resp Panel by RT-PCR (Flu A&B, Covid) Nasopharyngeal Swab     Status: None   Collection Time: 12/02/20  2:45 PM   Specimen: Nasopharyngeal Swab; Nasopharyngeal(NP) swabs in vial transport medium  Result Value Ref Range   SARS Coronavirus 2 by RT PCR NEGATIVE NEGATIVE   Influenza A by PCR NEGATIVE NEGATIVE   Influenza B by PCR NEGATIVE NEGATIVE  CBC with Differential/Platelet     Status: Abnormal   Collection Time: 12/03/20  5:31 AM  Result Value Ref Range   WBC 14.7 (H) 4.0 - 10.5 K/uL   RBC 3.87 3.87 - 5.11 MIL/uL   Hemoglobin 10.7 (L) 12.0 - 15.0 g/dL   HCT 12/05/20 (L) 37.6 - 28.3 %   MCV 83.2 80.0 - 100.0 fL   MCH 27.6 26.0 - 34.0 pg   MCHC 33.2 30.0 - 36.0 g/dL   RDW 15.1 76.1 - 60.7 %   Platelets 188 150 - 400 K/uL   nRBC 0.0 0.0 - 0.2 %   Neutrophils Relative % 58 %   Neutro Abs 8.5 (H) 1.7 - 7.7 K/uL   Lymphocytes Relative 26 %   Lymphs Abs 3.8 0.7 - 4.0 K/uL   Monocytes Relative 12 %   Monocytes Absolute 1.8 (H) 0.1 - 1.0  K/uL   Eosinophils Relative 3 %   Eosinophils Absolute 0.4 0.0 - 0.5 K/uL   Basophils Relative 0 %   Basophils Absolute 0.1 0.0 - 0.1 K/uL   Immature Granulocytes 1 %   Abs Immature Granulocytes 0.12 (H) 0.00 - 0.07 K/uL  Basic metabolic panel     Status: Abnormal   Collection Time: 12/03/20  5:31 AM  Result Value Ref Range   Sodium 135 135 - 145 mmol/L   Potassium 3.9 3.5 - 5.1 mmol/L   Chloride 101 98 - 111 mmol/L   CO2 25 22 - 32 mmol/L   Glucose, Bld 116 (H) 70 - 99 mg/dL   BUN 17 8 - 23 mg/dL   Creatinine, Ser 12/05/20 0.44 - 1.00 mg/dL   Calcium 9.5 8.9 - 0.62 mg/dL   GFR, Estimated 69.4 >85 mL/min   Anion gap 9 5 - 15  Magnesium     Status: Abnormal   Collection Time: 12/03/20  5:31 AM  Result Value Ref Range   Magnesium 1.6 (L) 1.7 - 2.4 mg/dL  No results found.  Assessment/Plan: 3 Days Post-Op   Principal Problem:   Trimalleolar fracture of left ankle Active Problems:   HTN (hypertension)   HLD (hyperlipidemia)   Syncope   Fall   Hypomagnesemia   CKD (chronic kidney disease), stage II   GERD (gastroesophageal reflux disease)   Leukocytosis   Hypokalemia  Patient is nonweightbearing on bilateral lower extremities.  I recommend she take enteric-coated aspirin 325 mg daily for DVT prophylaxis.  She should follow-up in my office in 2 weeks and will continue physical therapy while at her skilled nursing facility.  Patient will need a wheelchair with leg supports.  Patient should continue elevation of the lower extremities whenever possible.  She is bed to chair transfer only.    Juanell Fairly , MD 12/03/2020, 1:05 PM

## 2020-12-03 NOTE — TOC Progression Note (Signed)
Transition of Care [facility]) - Progression Note    Patient Details  Name: Sarah Higgins MRN: [id number] Date of Birth: Apr 17, 1935  Transition of Care [facility]) CM/SW Contact  Barrie Dunker, RN Phone Number: 12/03/2020, 9:10 AM  Clinical Narrative:   Recevied notification from [facility], The insurance auth was approved start date 5/24 next review date 5/26 Auth ID [id number], ref number [id number]         Expected Discharge Plan and Services                                                 Social Determinants of Health (SDOH) Interventions    Readmission Risk Interventions No flowsheet data found.

## 2020-12-03 NOTE — Progress Notes (Signed)
   12/03/20 1010  Clinical Encounter Type  Visited With Patient  Visit Type Initial  Referral From Chaplain  Consult/Referral To Chaplain  Spiritual Encounters  Spiritual Needs Emotional  Chaplain Ellan Tess visited Sarah Higgins in room 1A-137A. Sarah Higgins was admitted for left ankle fracture and splinting of right lateral malleolus fracture. Pt was resting at the time.  I said a silent prayer and will FU another time.

## 2020-12-03 NOTE — TOC Progression Note (Addendum)
Transition of Care [facility]) - Progression Note    Patient Details  Name: Sarah Higgins MRN: [id number] Date of Birth: 04-09-1935  Transition of Care [facility]) CM/SW Contact  Barrie Dunker, RN Phone Number: 12/03/2020, 1:01 PM  Clinical Narrative:   Richardson Chiquito thru cell phone and office phone, sent DC summary thru the hub requested the bed number for the patient to go to, the bedside nurse to call report to [phone] The patient is to go to room 605B, The bedside nurse attempted to call report without success I sent a message to Tammy asking that their nurse call here for report, I called [facility] and arranged transport there are 2 ahead of the patient        Expected Discharge Plan and Services           Expected Discharge Date: 12/03/20                                     Social Determinants of Health (SDOH) Interventions    Readmission Risk Interventions No flowsheet data found.

## 2020-12-03 NOTE — TOC Progression Note (Signed)
Transition of Care [facility]) - Progression Note    Patient Details  Name: Sarah Higgins MRN: [id number] Date of Birth: 30-Dec-1934  Transition of Care [facility]) CM/SW Contact  Barrie Dunker, RN Phone Number: 12/03/2020, 12:54 PM  Clinical Narrative:    Levie Heritage and left a message about the patient going to [facility] today, left my Contact information for a call back       Expected Discharge Plan and Services           Expected Discharge Date: 12/03/20                                     Social Determinants of Health (SDOH) Interventions    Readmission Risk Interventions No flowsheet data found.

## 2020-12-03 NOTE — Plan of Care (Signed)

## 2020-12-03 NOTE — Discharge Summary (Addendum)
Physician Discharge Summary  PRABHJOT MADDUX ZJI:[id number] DOB: 27-Feb-1935 DOA: 11/29/2020  PCP: Patient, No Pcp Per (Inactive)  Admit date: 11/29/2020 Discharge date: 12/03/2020  Discharge disposition: Skilled nursing facility   Recommendations for Outpatient Follow-Up:   Follow-up with Dr. Martha Clan, orthopedic surgeon, in 1 week.   Discharge Diagnosis:   Principal Problem:   Trimalleolar fracture of left ankle Active Problems:   HTN (hypertension)   HLD (hyperlipidemia)   Syncope   Fall   Hypomagnesemia   CKD (chronic kidney disease), stage II   GERD (gastroesophageal reflux disease)   Leukocytosis   Hypokalemia    Discharge Condition: Stable.  Diet recommendation:  Diet Order            Diet - low sodium heart healthy           Diet regular Room service appropriate? Yes; Fluid consistency: Thin  Diet effective now                   Code Status: Full Code     Hospital Course:   Ms. Kwanza Cancelliere is an 86 year old woman with with medical history significant for hypertension, hyperlipidemia, GERD, CKD stage II, who presented to the hospital because of syncope, fall and left ankle pain.  She was found to have bilateral ankle fracture.  No clear etiology of syncope was identified.  2D echo was unremarkable.  No evidence of acute stroke or abnormality on MRI brain.  She underwent ORIF of left trimalleolar ankle fracture dislocation on 11/30/2020.  She was evaluated by PT and OT recommended discharge to SNF.  Her condition has improved and she is deemed stable for discharge to SNF today.  Discharge plan was discussed with orthopedic surgeon, Dr. Martha Clan, via secure chat.  He recommended aspirin 325 mg daily for 4 weeks for DVT prophylaxis.    Medical Consultants:    Orthopedic surgeon   Discharge Exam:    Vitals:   12/02/20 1945 12/03/20 0415 12/03/20 0732 12/03/20 1135  BP: 130/61 (!) 119/50 (!) 125/58 113/68  Pulse: 81 79 76 73  Resp: 14 20 18  (!) 22   Temp: 99 F (37.2 C) 98.2 F (36.8 C) 98.8 F (37.1 C) 98.1 F (36.7 C)  TempSrc: Oral Oral Oral   SpO2: 99% 94% 95% 94%  Weight:      Height:         GEN: NAD SKIN: Warm and dry EYES: EOMI ENT: MMM CV: RRR PULM: CTA B ABD: soft, ND, NT, +BS CNS: AAO x 3, non focal EXT: Cast on the right leg.  Dressing on the left leg.   The results of significant diagnostics from this hospitalization (including imaging, microbiology, ancillary and laboratory) are listed below for reference.     Procedures and Diagnostic Studies:   DG HIP UNILAT WITH PELVIS 2-3 VIEWS LEFT  Result Date: 12/01/2020 CLINICAL DATA:  left hip pain since fall. EXAM: DG HIP (WITH OR WITHOUT PELVIS) 2-3V LEFT COMPARISON:  None. FINDINGS: AP view of the pelvis and AP views of the left hip. Femoral heads are located. No acute fracture. Left greater than right sacroiliac joint sclerosis is favored to be degenerative. Advanced lumbosacral junction spondylosis, suboptimally evaluated. IMPRESSION: No acute osseous abnormality. Electronically Signed   By: 12/03/2020 M.D.   On: 12/01/2020 13:51   ECHOCARDIOGRAM LIMITED  Result Date: 12/01/2020    ECHOCARDIOGRAM LIMITED REPORT   Patient Name:   CARISSA MUSICK Date of Exam: 12/01/2020 Medical Rec #:  [id number]     Height:       67.0 in Accession #:    [id number]    Weight:       189.6 lb Date of Birth:  December 14, 1934     BSA:          1.977 m Patient Age:    86 years      BP:           104/67 mmHg Patient Gender: F             HR:           73 bpm. Exam Location:  [facility] Procedure: Limited Echo, Limited Color Doppler and Cardiac Doppler Indications:     R55 Syncope  History:         Patient has no prior history of Echocardiogram examinations.                  Risk Factors:Hypertension and Dyslipidemia.  Sonographer:     Humphrey Rolls RDCS (AE) Referring Phys:  [id number] Juanell Fairly Diagnosing Phys: Julien Nordmann MD IMPRESSIONS  1. Left ventricular ejection fraction, by estimation, is 60  to 65%. The left ventricle has normal function. The left ventricle has no regional wall motion abnormalities. Left ventricular diastolic parameters are indeterminate.  2. Right ventricular systolic function is normal. The right ventricular size is normal. Tricuspid regurgitation signal is inadequate for assessing PA pressure.  3. The mitral valve was not well visualized. Mild mitral valve regurgitation.  4. The aortic valve was not well visualized. Aortic valve regurgitation is mild. Mild aortic valve sclerosis is present. No aortic valve gradient measured FINDINGS  Left Ventricle: Left ventricular ejection fraction, by estimation, is 60 to 65%. The left ventricle has normal function. The left ventricle has no regional wall motion abnormalities. The left ventricular internal cavity size was normal in size. There is  no left ventricular hypertrophy. Left ventricular diastolic parameters are indeterminate. Right Ventricle: The right ventricular size is normal. No increase in right ventricular wall thickness. Right ventricular systolic function is normal. Tricuspid regurgitation signal is inadequate for assessing PA pressure. Left Atrium: Left atrial size was normal in size. Right Atrium: Right atrial size was normal in size. Pericardium: There is no evidence of pericardial effusion. Mitral Valve: The mitral valve was not well visualized. Mild mitral valve regurgitation. No evidence of mitral valve stenosis. Tricuspid Valve: The tricuspid valve is normal in structure. Tricuspid valve regurgitation is not demonstrated. No evidence of tricuspid stenosis. Aortic Valve: The aortic valve was not well visualized. Aortic valve regurgitation is mild. Mild aortic valve sclerosis is present, with no evidence of aortic valve stenosis. Pulmonic Valve: The pulmonic valve was not well visualized. Pulmonic valve regurgitation is not visualized. No evidence of pulmonic stenosis. Aorta: The aortic root is normal in size and structure.  Venous: The pulmonary veins were not well visualized. The inferior vena cava is normal in size with greater than 50% respiratory variability, suggesting right atrial pressure of 3 mmHg. IAS/Shunts: No atrial level shunt detected by color flow Doppler. LEFT VENTRICLE PLAX 2D LVIDd:         3.40 cm LVIDs:         2.00 cm LV PW:         0.90 cm LV IVS:        0.70 cm  LEFT ATRIUM         Index LA diam:    1.80 cm 0.91 cm/m Julien Nordmann MD Electronically  signed by Julien Nordmann MD Signature Date/Time: 12/01/2020/8:47:48 AM    Final      Labs:   Basic Metabolic Panel: Recent Labs  Lab 11/29/20 1509 11/30/20 0527 12/01/20 0509 12/03/20 0531  NA 138 134* 136 135  K 3.5 3.4* 4.3 3.9  CL 102 97* 102 101  CO2 GLUCOSE 99 112* 141* 116*  BUN CREATININE 1.06* 0.78 0.84 0.59  CALCIUM 9.2 9.0 9.2 9.5  MG 1.6*  --  1.6* 1.6*   GFR Estimated Creatinine Clearance: 59 mL/min (by C-G formula based on SCr of 0.59 mg/dL). Liver Function Tests: Recent Labs  Lab 11/29/20 1509  AST 20  ALT 16  ALKPHOS 83  BILITOT 0.7  PROT 7.7  ALBUMIN 3.8   Recent Labs  Lab 11/29/20 1509  LIPASE 73*   No results for input(s): AMMONIA in the last 168 hours. Coagulation profile Recent Labs  Lab 11/29/20 1654  INR 1.1    CBC: Recent Labs  Lab 11/29/20 1509 11/30/20 0527 12/01/20 0509 12/03/20 0531  WBC 17.2* 13.0* 18.3* 14.7*  NEUTROABS 10.5*  --  13.5* 8.5*  HGB 12.8 12.0 11.0* 10.7*  HCT 39.7 37.3 32.8* 32.2*  MCV 84.3 84.2 83.9 83.2  PLT 236 182 161 188   Cardiac Enzymes: No results for input(s): CKTOTAL, CKMB, CKMBINDEX, TROPONINI in the last 168 hours. BNP: Invalid input(s): POCBNP CBG: Recent Labs  Lab 11/29/20 1511 12/01/20 2121  GLUCAP 101* 109*   D-Dimer No results for input(s): DDIMER in the last 72 hours. Hgb A1c No results for input(s): HGBA1C in the last 72 hours. Lipid Profile No results for input(s): CHOL, HDL, LDLCALC, TRIG, CHOLHDL,  LDLDIRECT in the last 72 hours. Thyroid function studies No results for input(s): TSH, T4TOTAL, T3FREE, THYROIDAB in the last 72 hours.  Invalid input(s): FREET3 Anemia work up No results for input(s): VITAMINB12, FOLATE, FERRITIN, TIBC, IRON, RETICCTPCT in the last 72 hours. Microbiology Recent Results (from the past 240 hour(s))  Resp Panel by RT-PCR (Flu A&B, Covid) Nasopharyngeal Swab     Status: None   Collection Time: 11/29/20  3:09 PM   Specimen: Nasopharyngeal Swab; Nasopharyngeal(NP) swabs in vial transport medium  Result Value Ref Range Status   SARS Coronavirus 2 by RT PCR NEGATIVE NEGATIVE Final    Comment: (NOTE) SARS-CoV-2 target nucleic acids are NOT DETECTED.  The SARS-CoV-2 RNA is generally detectable in upper respiratory specimens during the acute phase of infection. The lowest concentration of SARS-CoV-2 viral copies this assay can detect is 138 copies/mL. A negative result does not preclude SARS-Cov-2 infection and should not be used as the sole basis for treatment or other patient management decisions. A negative result may occur with  improper specimen collection/handling, submission of specimen other than nasopharyngeal swab, presence of viral mutation(s) within the areas targeted by this assay, and inadequate number of viral copies(<138 copies/mL). A negative result must be combined with clinical observations, patient history, and epidemiological information. The expected result is Negative.  Fact Sheet for Patients:  BloggerCourse.com  Fact Sheet for Healthcare Providers:  SeriousBroker.it  This test is no t yet approved or cleared by the [place] FDA and  has been authorized for detection and/or diagnosis of SARS-CoV-2 by FDA under an Emergency Use Authorization (EUA). This EUA will remain  in effect (meaning this test can be used) for the duration of the COVID-19 declaration under Section  564(b)(1) of the Act, 21 U.S.C.section 360bbb-3(b)(1), unless  the authorization is terminated  or revoked sooner.       Influenza A by PCR NEGATIVE NEGATIVE Final   Influenza B by PCR NEGATIVE NEGATIVE Final    Comment: (NOTE) The Xpert Xpress SARS-CoV-2/FLU/RSV plus assay is intended as an aid in the diagnosis of influenza from Nasopharyngeal swab specimens and should not be used as a sole basis for treatment. Nasal washings and aspirates are unacceptable for Xpert Xpress SARS-CoV-2/FLU/RSV testing.  Fact Sheet for Patients: BloggerCourse.comhttps://www.fda.gov/media/152166/download  Fact Sheet for Healthcare Providers: SeriousBroker.ithttps://www.fda.gov/media/152162/download  This test is not yet approved or cleared by the [place] FDA and has been authorized for detection and/or diagnosis of SARS-CoV-2 by FDA under an Emergency Use Authorization (EUA). This EUA will remain in effect (meaning this test can be used) for the duration of the COVID-19 declaration under Section 564(b)(1) of the Act, 21 U.S.C. section 360bbb-3(b)(1), unless the authorization is terminated or revoked.  Performed at [facility], [address] [place], [place] [address]   Surgical pcr screen     Status: Abnormal   Collection Time: 11/29/20  6:51 PM   Specimen: Nasal Mucosa; Nasal Swab  Result Value Ref Range Status   MRSA, PCR POSITIVE (A) NEGATIVE Final   Staphylococcus aureus POSITIVE (A) NEGATIVE Final    Comment: RESULT CALLED TO, READ BACK BY AND VERIFIED WITH: MATTHEW WRIGHT @ 2031 11/29/2020 (NOTE) The Xpert SA Assay (FDA approved for NASAL specimens in patients 84 years of age and older), is one component of a comprehensive surveillance program. It is not intended to diagnose infection nor to guide or monitor treatment. Performed at [facility], [address] [place], [place] [address]   Resp Panel by RT-PCR (Flu A&B, Covid) Nasopharyngeal Swab     Status: None   Collection Time:  12/02/20  2:45 PM   Specimen: Nasopharyngeal Swab; Nasopharyngeal(NP) swabs in vial transport medium  Result Value Ref Range Status   SARS Coronavirus 2 by RT PCR NEGATIVE NEGATIVE Final    Comment: (NOTE) SARS-CoV-2 target nucleic acids are NOT DETECTED.  The SARS-CoV-2 RNA is generally detectable in upper respiratory specimens during the acute phase of infection. The lowest concentration of SARS-CoV-2 viral copies this assay can detect is 138 copies/mL. A negative result does not preclude SARS-Cov-2 infection and should not be used as the sole basis for treatment or other patient management decisions. A negative result may occur with  improper specimen collection/handling, submission of specimen other than nasopharyngeal swab, presence of viral mutation(s) within the areas targeted by this assay, and inadequate number of viral copies(<138 copies/mL). A negative result must be combined with clinical observations, patient history, and epidemiological information. The expected result is Negative.  Fact Sheet for Patients:  BloggerCourse.comhttps://www.fda.gov/media/152166/download  Fact Sheet for Healthcare Providers:  SeriousBroker.ithttps://www.fda.gov/media/152162/download  This test is no t yet approved or cleared by the [place] FDA and  has been authorized for detection and/or diagnosis of SARS-CoV-2 by FDA under an Emergency Use Authorization (EUA). This EUA will remain  in effect (meaning this test can be used) for the duration of the COVID-19 declaration under Section 564(b)(1) of the Act, 21 U.S.C.section 360bbb-3(b)(1), unless the authorization is terminated  or revoked sooner.       Influenza A by PCR NEGATIVE NEGATIVE Final   Influenza B by PCR NEGATIVE NEGATIVE Final    Comment: (NOTE) The Xpert Xpress SARS-CoV-2/FLU/RSV plus assay is intended as an aid in the diagnosis of influenza from Nasopharyngeal swab specimens and should not  be used as a sole basis for treatment. Nasal washings  and aspirates are unacceptable for Xpert Xpress SARS-CoV-2/FLU/RSV testing.  Fact Sheet for Patients: BloggerCourse.com  Fact Sheet for Healthcare Providers: SeriousBroker.it  This test is not yet approved or cleared by the [place] FDA and has been authorized for detection and/or diagnosis of SARS-CoV-2 by FDA under an Emergency Use Authorization (EUA). This EUA will remain in effect (meaning this test can be used) for the duration of the COVID-19 declaration under Section 564(b)(1) of the Act, 21 U.S.C. section 360bbb-3(b)(1), unless the authorization is terminated or revoked.  Performed at [facility], [address], [place], [place] [address]      Discharge Instructions:   Discharge Instructions    Diet - low sodium heart healthy   Complete by: As directed    Discharge instructions   Complete by: As directed    Nonweightbearing on bilateral lower extremities until further recommendations from orthopedic surgeon.   Discharge wound care:   Complete by: As directed    Follow-up with orthopedic surgeon for wound care.   Increase activity slowly   Complete by: As directed      Allergies as of 12/03/2020   No Known Allergies     Medication List    TAKE these medications   acetaminophen 325 MG tablet Commonly known as: TYLENOL Take 2 tablets (650 mg total) by mouth every 6 (six) hours as needed for fever, headache or moderate pain.   aspirin EC 325 MG tablet Take 1 tablet (325 mg total) by mouth daily for 28 days.   benzonatate 100 MG capsule Commonly known as: TESSALON Take 100 mg by mouth 3 (three) times daily as needed for cough.   fluticasone 50 MCG/ACT nasal spray Commonly known as: FLONASE Place 1 spray into both nostrils daily.   gabapentin 100 MG capsule Commonly known as: NEURONTIN Take 200 mg by mouth 3 (three) times daily.   HYDROcodone-acetaminophen 5-325 MG tablet Commonly  known as: NORCO/VICODIN Take 1 tablet by mouth every 8 (eight) hours as needed for moderate pain.   latanoprost 0.005 % ophthalmic solution Commonly known as: XALATAN Place 1 drop into both eyes at bedtime.   levocetirizine 5 MG tablet Commonly known as: XYZAL Take 5 mg by mouth daily.   loperamide 2 MG capsule Commonly known as: IMODIUM Take 4 mg by mouth 2 (two) times daily as needed for diarrhea or loose stools.   lovastatin 20 MG tablet Commonly known as: MEVACOR Take 20 mg by mouth every evening.   omeprazole 40 MG capsule Commonly known as: PRILOSEC Take 40 mg by mouth daily.   oxybutynin 10 MG 24 hr tablet Commonly known as: DITROPAN-XL Take 10 mg by mouth daily.   polyethylene glycol 17 g packet Commonly known as: MIRALAX / GLYCOLAX Take 17 g by mouth daily as needed for mild constipation.   senna-docusate 8.6-50 MG tablet Commonly known as: Senokot-S Take 1 tablet by mouth at bedtime as needed for mild constipation.   sodium chloride HYPERTONIC 3 % nebulizer solution Take 4 mLs by nebulization 2 (two) times daily.   venlafaxine XR 150 MG 24 hr capsule Commonly known as: EFFEXOR-XR Take 150 mg by mouth daily.            Durable Medical Equipment  (From admission, onward)         Start     Ordered   12/02/20 0928  For home use only DME Eelevated commode seat  Once  Comments: 3 in 1   12/02/20 0927           Discharge Care Instructions  (From admission, onward)         Start     Ordered   12/03/20 0000  Discharge wound care:       Comments: Follow-up with orthopedic surgeon for wound care.   12/03/20 1213          Follow-up Information    Juanell Fairly, MD. Schedule an appointment as soon as possible for a visit in 1 week(s).   Specialty: Orthopedic Surgery Contact information: [address] [place] [address] [phone]                Time coordinating discharge: 49 miniutes  Signed:  Lurene Shadow  [facility] 12/03/2020, 12:13 PM   Pager on [email or web address]. If 7PM-7AM, please contact night-coverage at [email or web address]

## 2020-12-04 ENCOUNTER — Emergency Department: Payer: Medicare Other

## 2020-12-04 ENCOUNTER — Encounter: Payer: Self-pay | Admitting: *Deleted

## 2020-12-04 ENCOUNTER — Other Ambulatory Visit: Payer: Self-pay

## 2020-12-04 DIAGNOSIS — N182 Chronic kidney disease, stage 2 (mild): Secondary | ICD-10-CM | POA: Insufficient documentation

## 2020-12-04 DIAGNOSIS — K5900 Constipation, unspecified: Secondary | ICD-10-CM | POA: Diagnosis present

## 2020-12-04 DIAGNOSIS — Z7982 Long term (current) use of aspirin: Secondary | ICD-10-CM | POA: Insufficient documentation

## 2020-12-04 DIAGNOSIS — Z20822 Contact with and (suspected) exposure to covid-19: Secondary | ICD-10-CM | POA: Diagnosis not present

## 2020-12-04 DIAGNOSIS — Z79899 Other long term (current) drug therapy: Secondary | ICD-10-CM | POA: Insufficient documentation

## 2020-12-04 DIAGNOSIS — I129 Hypertensive chronic kidney disease with stage 1 through stage 4 chronic kidney disease, or unspecified chronic kidney disease: Secondary | ICD-10-CM | POA: Diagnosis not present

## 2020-12-04 LAB — BASIC METABOLIC PANEL
Anion gap: 12 (ref 5–15)
BUN: 12 mg/dL (ref 8–23)
CO2: 24 mmol/L (ref 22–32)
Calcium: 9.9 mg/dL (ref 8.9–10.3)
Chloride: 100 mmol/L (ref 98–111)
Creatinine, Ser: 0.44 mg/dL (ref 0.44–1.00)
GFR, Estimated: >60 mL/min (ref >60)
Glucose, Bld: 129 mg/dL — ABNORMAL HIGH (ref 70–99)
Potassium: 3.8 mmol/L (ref 3.5–5.1)
Sodium: 136 mmol/L (ref 135–145)

## 2020-12-04 LAB — CBC
HCT: 34 % — ABNORMAL LOW (ref 36.0–46.0)
Hemoglobin: 11.2 g/dL — ABNORMAL LOW (ref 12.0–15.0)
MCH: 27.5 pg (ref 26.0–34.0)
MCHC: 32.9 g/dL (ref 30.0–36.0)
MCV: 83.5 fL (ref 80.0–100.0)
Platelets: 241 10*3/uL (ref 150–400)
RBC: 4.07 MIL/uL (ref 3.87–5.11)
RDW: 12.6 % (ref 11.5–15.5)
WBC: 14.5 10*3/uL — ABNORMAL HIGH (ref 4.0–10.5)
nRBC: 0 % (ref 0.0–0.2)

## 2020-12-04 LAB — LIPASE, BLOOD: Lipase: 42 U/L (ref 11–51)

## 2020-12-04 NOTE — ED Triage Notes (Signed)
Pt brought in via ems from [facility] with constipation.  Pt broke both ankles and has cast and splints on both legs.  Pt has not had a bm in 2 days. Pt has abd pain. Pt alert.

## 2020-12-05 ENCOUNTER — Emergency Department
Admission: EM | Admit: 2020-12-05 | Discharge: 2020-12-05 | Disposition: A | Discharge status: Home / Self Care | Payer: Medicare Other | Attending: Emergency Medicine | Admitting: Emergency Medicine

## 2020-12-05 ENCOUNTER — Emergency Department: Payer: Medicare Other

## 2020-12-05 DIAGNOSIS — K5900 Constipation, unspecified: Secondary | ICD-10-CM

## 2020-12-05 LAB — RESP PANEL BY RT-PCR (FLU A&B, COVID) ARPGX2
Influenza A by PCR: NEGATIVE
Influenza B by PCR: NEGATIVE
SARS Coronavirus 2 by RT PCR: NEGATIVE

## 2020-12-05 MED ORDER — OXYBUTYNIN CHLORIDE ER 10 MG PO TB24
10.0000 mg | ORAL_TABLET | Freq: Every day | ORAL | Status: DC
  Administered 2020-12-05: 10 mg via ORAL
  Filled 2020-12-05: qty 1

## 2020-12-05 MED ORDER — HYDROCODONE-ACETAMINOPHEN 5-325 MG PO TABS: 1.0000 | ORAL_TABLET | ORAL | 0 refills | Status: AC | PRN

## 2020-12-05 MED ORDER — ACETAMINOPHEN 325 MG PO TABS: 650.0000 mg | ORAL_TABLET | Freq: Four times a day (QID) | ORAL | Status: DC | PRN

## 2020-12-05 MED ORDER — PRAVASTATIN SODIUM 20 MG PO TABS
20.0000 mg | ORAL_TABLET | Freq: Every day | ORAL | Status: DC
  Filled 2020-12-05: qty 1

## 2020-12-05 MED ORDER — HYDROCODONE-ACETAMINOPHEN 5-325 MG PO TABS
1.0000 | ORAL_TABLET | Freq: Three times a day (TID) | ORAL | Status: DC | PRN
  Administered 2020-12-05: 1 via ORAL
  Filled 2020-12-05: qty 1

## 2020-12-05 MED ORDER — SENNOSIDES-DOCUSATE SODIUM 8.6-50 MG PO TABS: 1.0000 | ORAL_TABLET | Freq: Every evening | ORAL | Status: DC | PRN

## 2020-12-05 MED ORDER — ASPIRIN EC 325 MG PO TBEC
325.0000 mg | DELAYED_RELEASE_TABLET | Freq: Every day | ORAL | Status: DC
  Administered 2020-12-05: 325 mg via ORAL
  Filled 2020-12-05: qty 1

## 2020-12-05 MED ORDER — LATANOPROST 0.005 % OP SOLN
1.0000 [drp] | Freq: Every day | OPHTHALMIC | Status: DC
  Filled 2020-12-05: qty 2.5

## 2020-12-05 MED ORDER — GABAPENTIN 100 MG PO CAPS
200.0000 mg | ORAL_CAPSULE | Freq: Three times a day (TID) | ORAL | Status: DC
  Administered 2020-12-05: 200 mg via ORAL
  Filled 2020-12-05: qty 2

## 2020-12-05 MED ORDER — VENLAFAXINE HCL ER 150 MG PO CP24
150.0000 mg | ORAL_CAPSULE | Freq: Every day | ORAL | Status: DC
  Administered 2020-12-05: 150 mg via ORAL
  Filled 2020-12-05: qty 1

## 2020-12-05 MED ORDER — PANTOPRAZOLE SODIUM 40 MG PO TBEC
80.0000 mg | DELAYED_RELEASE_TABLET | Freq: Every day | ORAL | Status: DC
  Administered 2020-12-05: 80 mg via ORAL
  Filled 2020-12-05: qty 2

## 2020-12-05 MED ORDER — POLYETHYLENE GLYCOL 3350 17 G PO PACK: 17.0000 g | PACK | Freq: Every day | ORAL | Status: DC | PRN

## 2020-12-05 MED ORDER — LACTULOSE 10 GM/15ML PO SOLN
30.0000 g | Freq: Once | ORAL | Status: AC
  Administered 2020-12-05: 30 g via ORAL
  Filled 2020-12-05: qty 60

## 2020-12-05 NOTE — TOC Transition Note (Addendum)
Transition of Care [facility]) - CM/SW Discharge Note   Patient Details  Name: RAYLYN CARTON MRN: [id number] Date of Birth: 21-Jun-1935  Transition of Care [facility]) CM/SW Contact:  Marina Goodell Phone Number: [phone] 12/05/2020, 10:47 AM   Clinical Narrative:     CSW spoke with patient who stated she had spoken with her daughter and son and they both agree with her that she should return home.  CSW went over the differences between services for home health and SNF.  CSW also reminded the patient I would not be able to guarantee a home health agency would be able to accept her for services. Patient verbalized understanding.  CSW stated if a home health agency was not able to accept her for services she would need to reach out to her PCP for assistance in finding a home health agency.  Patient verbalized understanding.  CSW stated if a home health agency was not able to accept her for services, she may need to go to outpatient PT and she would need to arrange transportation. Patient verbalized understanding. Patient stated the physical therapist at the facility has recommended a wheelchair and 3 in 1 bedside commode for home.  CSW stated I would contact [employer] and they would drop ship the items at home. CSW asked the patient if she was certain of her decision and patient stated she was. CSW updated EDP/ED Staff. Patient will transport home via EMS. CSW will contact Tammy at [facility] to update her on patient's decision not to return to facility.    Barriers to Discharge: SNF Pending discharge summary   Patient Goals and CMS Choice        Discharge Placement                       Discharge Plan and Services                                     Social Determinants of Health (SDOH) Interventions     Readmission Risk Interventions No flowsheet data found.

## 2020-12-05 NOTE — Progress Notes (Signed)
    Durable Medical Equipment  (From admission, onward)         Start     Ordered   12/05/20 1053  For home use only DME standard manual wheelchair with seat cushion  Once       Comments: Patient suffers from bilateral ankle fractures which impairs their ability to perform daily activities like bathing, dressing, feeding, grooming, and toileting in the home.  A cane or crutch will not resolve issue with performing activities of daily living. A wheelchair will allow patient to safely perform daily activities. Patient can safely propel the wheelchair in the home or has a caregiver who can provide assistance. Length of need 6 months . Accessories: elevating leg rests (ELRs), wheel locks, extensions and anti-tippers.   12/05/20 1053   12/05/20 1052  For home use only DME 3 n 1  Once        12/05/20 1051

## 2020-12-05 NOTE — ED Notes (Signed)
Called [facility] for transport to [address] [place]  [address]

## 2020-12-05 NOTE — TOC Initial Note (Signed)
Transition of Care [facility]) - Initial/Assessment Note    Patient Details  Name: Sarah Higgins MRN: [id number] Date of Birth: [birth year]  Transition of Care [facility]) CM/SW Contact:    Marina Goodell Phone Number: [phone] 12/05/2020, 9:02 AM  Clinical Narrative:                 Patient presents to [facility] ED due to not having a BM for two days. Patient d/c from [facility] to [facility] on 12/03/2020.  CSW spoke with patient.  Patient stated she did not want to return to [facility] because her adult diaper was not changed for 12 hours and when she requests something she feels as if the staff does not want to do it. Patient stated she wanted to either find another SNF placement or to return home with home health.  CSW explained to patient she was not going to be admitted to the hospital and since she already has a safe placement she would need to return to that placement.  CSW explained if the patient chose to return home with home health, the home health agency would not be available for 24/7 care nor would she receive home health services everyday of the week. She would need to arrange for private care if her spouse was not able to assist her with her needs.  CSW also stated there was not a guarantee that I would be able to confirm a home health agency would be able to take her.  Patient verbalized understanding and requested I contact her at 10:00AM.  CSW spoke with Tammy at [facility] and conveyed the patient's concerns about returning to the facility.  Tammy stated she would speak with the staff and contact this CSW once she did.  Tammy stated she could confirm the patient can return to [facility] and would contact this CSW with room number.   Barriers to Discharge: SNF Pending discharge summary   Patient Goals and CMS Choice        Expected Discharge Plan and Services                                                Prior Living Arrangements/Services                        Activities of Daily Living      Permission Sought/Granted                  Emotional Assessment              Admission diagnosis:  Constipation - EMS Patient Active Problem List   Diagnosis Date Noted  . Hypokalemia 11/30/2020  . Trimalleolar fracture of left ankle 11/29/2020  . HLD (hyperlipidemia) 11/29/2020  . Syncope 11/29/2020  . Fall 11/29/2020  . Hypomagnesemia 11/29/2020  . Closed left ankle fracture 11/29/2020  . CKD (chronic kidney disease), stage II 11/29/2020  . GERD (gastroesophageal reflux disease) 11/29/2020  . Leukocytosis 11/29/2020  . HYPERLIPIDEMIA 01/07/2010  . HTN (hypertension) 01/07/2010  . UPPER RESPIRATORY INFECTION, ACUTE 01/07/2010   PCP:  System, Provider Not In Pharmacy:   [facility] [address], [place] - [address] [place] [address] Phone: [phone] Fax: [phone]     Social Determinants of Health (SDOH) Interventions    Readmission Risk  Interventions No flowsheet data found.

## 2020-12-05 NOTE — TOC Progression Note (Signed)
Transition of Care [facility]) - Progression Note    Patient Details  Name: Sarah Higgins MRN: [id number] Date of Birth: 1934/09/04  Transition of Care [facility]) CM/SW Contact  Joseph Art, [place] Phone Number: 12/05/2020, 12:34 PM  Clinical Narrative:     [employer] [facility] confirmed DME orders, progress note for wheelchair signed by EDP, DME will be drop shipped to patient's home.    Barriers to Discharge: SNF Pending discharge summary  Expected Discharge Plan and Services                                                 Social Determinants of Health (SDOH) Interventions    Readmission Risk Interventions No flowsheet data found.

## 2020-12-05 NOTE — ED Notes (Signed)
Pt had large BM.  

## 2020-12-05 NOTE — ED Provider Notes (Signed)
[facility] Emergency Department Provider Note   ____________________________________________   Event Date/Time   First MD Initiated Contact with Patient 12/05/20 0209     (approximate)  I have reviewed the triage vital signs and the nursing notes.   HISTORY  Chief Complaint Constipation    HPI Sarah Higgins is a 86 y.o. female brought to the ED via EMS from SNF with a chief complaint of constipation.  Patient had recent surgery of bilateral ankle fractures and is rehabilitating at [facility].  Complains of constipation.  No BM in 2 days; patient usually has daily BMs.  Complains of abdominal bloating.  Denies fever, cough, chest pain, shortness of breath, nausea, vomiting, urinary retention, diarrhea.     Past Medical History:  Diagnosis Date  . GERD (gastroesophageal reflux disease)   . HLD (hyperlipidemia)   . Hypertension     Patient Active Problem List   Diagnosis Date Noted  . Hypokalemia 11/30/2020  . Trimalleolar fracture of left ankle 11/29/2020  . HLD (hyperlipidemia) 11/29/2020  . Syncope 11/29/2020  . Fall 11/29/2020  . Hypomagnesemia 11/29/2020  . Closed left ankle fracture 11/29/2020  . CKD (chronic kidney disease), stage II 11/29/2020  . GERD (gastroesophageal reflux disease) 11/29/2020  . Leukocytosis 11/29/2020  . HYPERLIPIDEMIA 01/07/2010  . HTN (hypertension) 01/07/2010  . UPPER RESPIRATORY INFECTION, ACUTE 01/07/2010    Past Surgical History:  Procedure Laterality Date  . ABDOMINAL HYSTERECTOMY    . ORIF ANKLE FRACTURE Left 11/30/2020   Procedure: OPEN REDUCTION INTERNAL FIXATION (ORIF) ANKLE FRACTURE;  Surgeon: Juanell Fairly, MD;  Location: [facility] ORS;  Service: Orthopedics;  Laterality: Left;    Prior to Admission medications   Medication Sig Start Date End Date Taking? Authorizing Provider  acetaminophen (TYLENOL) 325 MG tablet Take 2 tablets (650 mg total) by mouth every 6 (six) hours as needed for fever,  headache or moderate pain. 12/03/20  Yes Lurene Shadow, MD  aspirin EC 325 MG tablet Take 1 tablet (325 mg total) by mouth daily for 28 days. 12/03/20 12/31/20 Yes Lurene Shadow, MD  benzonatate (TESSALON) 100 MG capsule Take 100 mg by mouth 3 (three) times daily as needed for cough.   Yes [provider]  fluticasone (FLONASE) 50 MCG/ACT nasal spray Place 1 spray into both nostrils daily.   Yes [provider]  gabapentin (NEURONTIN) 100 MG capsule Take 200 mg by mouth 3 (three) times daily.   Yes [provider]  HYDROcodone-acetaminophen (NORCO/VICODIN) 5-325 MG tablet Take 1 tablet by mouth every 8 (eight) hours as needed for moderate pain. 12/03/20  Yes Lurene Shadow, MD  latanoprost (XALATAN) 0.005 % ophthalmic solution Place 1 drop into both eyes at bedtime.   Yes [provider]  levocetirizine (XYZAL) 5 MG tablet Take 5 mg by mouth daily.   Yes [provider]  loperamide (IMODIUM) 2 MG capsule Take 4 mg by mouth 2 (two) times daily as needed for diarrhea or loose stools.   Yes [provider]  lovastatin (MEVACOR) 20 MG tablet Take 20 mg by mouth every evening.   Yes [provider]  omeprazole (PRILOSEC) 40 MG capsule Take 40 mg by mouth daily.   Yes [provider]  oxybutynin (DITROPAN-XL) 10 MG 24 hr tablet Take 10 mg by mouth daily.   Yes [provider]  polyethylene glycol (MIRALAX / GLYCOLAX) 17 g packet Take 17 g by mouth daily as needed for mild constipation. 12/03/20  Yes Lurene Shadow, MD  senna-docusate (SENOKOT-S) 8.6-50 MG tablet Take 1 tablet by mouth at bedtime as needed for mild constipation. 12/03/20  Yes Lurene Shadow, MD  sodium chloride HYPERTONIC 3 % nebulizer solution Take 4 mLs by nebulization 2 (two) times daily.   Yes [provider]  venlafaxine XR (EFFEXOR-XR) 150 MG 24 hr capsule Take 150 mg by mouth daily.   Yes [provider]    Allergies Patient has no known  allergies.  Family History  Problem Relation Age of Onset  . Hypertension Mother   . Hypertension Father     Social History Social History   Tobacco Use  . Smoking status: Never Smoker  . Smokeless tobacco: Never Used  Substance Use Topics  . Alcohol use: Not Currently  . Drug use: Not Currently    Review of Systems  Constitutional: No fever/chills Eyes: No visual changes. ENT: No sore throat. Cardiovascular: Denies chest pain. Respiratory: Denies shortness of breath. Gastrointestinal: Positive for abdominal bloating and constipation.  No nausea, no vomiting.  No diarrhea.   Genitourinary: Negative for dysuria. Musculoskeletal: Negative for back pain. Skin: Negative for rash. Neurological: Negative for headaches, focal weakness or numbness.   ____________________________________________   PHYSICAL EXAM:  VITAL SIGNS: ED Triage Vitals  Enc Vitals Group     BP 12/04/20 2134 131/65     Pulse Rate 12/04/20 2131 81     Resp 12/04/20 2131 20     Temp 12/04/20 2131 98.1 F (36.7 C)     Temp Source 12/04/20 2131 Oral     SpO2 12/04/20 2131 100 %     Weight 12/04/20 2131 189 lb (85.7 kg)     Height 12/04/20 2131 5\' 7"  (1.702 m)     Head Circumference --      [facility] --      Pain Score 12/04/20 2131 10     Pain Loc --      Pain Edu? --      Excl. in GC? --     Constitutional: Alert and oriented.  Elderly appearing and in no acute distress. Eyes: Conjunctivae are normal. PERRL. EOMI. Head: Atraumatic. Nose: No congestion/rhinnorhea. Mouth/Throat: Mucous membranes are moist.   Neck: No stridor.   Cardiovascular: Normal rate, regular rhythm. Grossly normal heart sounds.  Good peripheral circulation. Respiratory: Normal respiratory effort.  No retractions. Lungs CTAB. Gastrointestinal: Soft and nontender to light or deep palpation.  Mild distention. No abdominal bruits. No CVA tenderness. Musculoskeletal: RLE cast.  LLE dressing.. Neurologic:  Normal speech and  language. No gross focal neurologic deficits are appreciated.  Skin:  Skin is warm, dry and intact. No rash noted. Psychiatric: Mood and affect are normal. Speech and behavior are normal.  ____________________________________________   LABS (all labs ordered are listed, but only abnormal results are displayed)  Labs Reviewed  BASIC METABOLIC PANEL - Abnormal; Notable for the following components:      Result Value   Glucose, Bld 129 (*)    All other components within normal limits  CBC - Abnormal; Notable for the following components:   WBC 14.5 (*)    Hemoglobin 11.2 (*)    HCT 34.0 (*)    All other components within normal limits  LIPASE, BLOOD  URINALYSIS, COMPLETE (UACMP) WITH MICROSCOPIC   ____________________________________________  EKG  None ____________________________________________  RADIOLOGY I, Areonna Bran J, personally viewed and evaluated these images (plain radiographs) as part of my medical decision making, as well as reviewing the written report by the radiologist.  ED  MD interpretation: Constipation  Official radiology report(s): DG Abdomen 1 View  Result Date: 12/05/2020 CLINICAL DATA:  Constipation. EXAM: ABDOMEN - 1 VIEW COMPARISON:  None. FINDINGS: Stool throughout the colon. The bowel gas pattern is normal. Several densities overlie the pelvis likely representing phleboliths. No radio-opaque calculi or other significant radiographic abnormality are seen. IMPRESSION: Constipation. Electronically Signed   By: Tish Frederickson M.D.   On: 12/05/2020 02:48    ____________________________________________   PROCEDURES  Procedure(s) performed (including Critical Care):  Procedures   ____________________________________________   INITIAL IMPRESSION / ASSESSMENT AND PLAN / ED COURSE  As part of my medical decision making, I reviewed the following data within the electronic MEDICAL RECORD NUMBER History obtained from family, Nursing notes reviewed and  incorporated, Labs reviewed, Old chart reviewed, Radiograph reviewed and Notes from prior ED visits     86 year old female who presents with constipation.  Differential diagnosis includes but is not limited to constipation secondary to recent surgery and narcotic use, ileus, SBO, etc.  Will obtain x-ray imaging study and administer soapsuds enema  Clinical Course as of 12/05/20 0648  Fri Dec 05, 2020  0356 Patient had 2 satisfactory bowel movements.  Refuses to go back to [facility].  States if she has to do she would rather walk home.  Husband agrees; states he will get arrested if she returns there.  Will hold patient for social work evaluation in the morning for alternate placement or discharge home with home health. [JS]    Clinical Course User Index [JS] Irean Hong, MD     ____________________________________________   FINAL CLINICAL IMPRESSION(S) / ED DIAGNOSES  Final diagnoses:  Constipation, unspecified constipation type     ED Discharge Orders    None      *Please note:  REN GRASSE was evaluated in Emergency Department on 12/05/2020 for the symptoms described in the history of present illness. She was evaluated in the context of the global COVID-19 pandemic, which necessitated consideration that the patient might be at risk for infection with the SARS-CoV-2 virus that causes COVID-19. Institutional protocols and algorithms that pertain to the evaluation of patients at risk for COVID-19 are in a state of rapid change based on information released by regulatory bodies including the CDC and federal and state organizations. These policies and algorithms were followed during the patient's care in the ED.  Some ED evaluations and interventions may be delayed as a result of limited staffing during and the pandemic.*   Note:  This document was prepared using Dragon voice recognition software and may include unintentional dictation errors.   Irean Hong, MD 12/05/20  [phone]

## 2021-10-28 IMAGING — DX DG ANKLE COMPLETE 3+V*L*
3 series · 3 of 3 positions shown · non-contrast
Comparison: None.

CLINICAL DATA: Fall, pain, deformity

EXAM:
LEFT ANKLE COMPLETE - 3+ VIEW

[ankle ap]
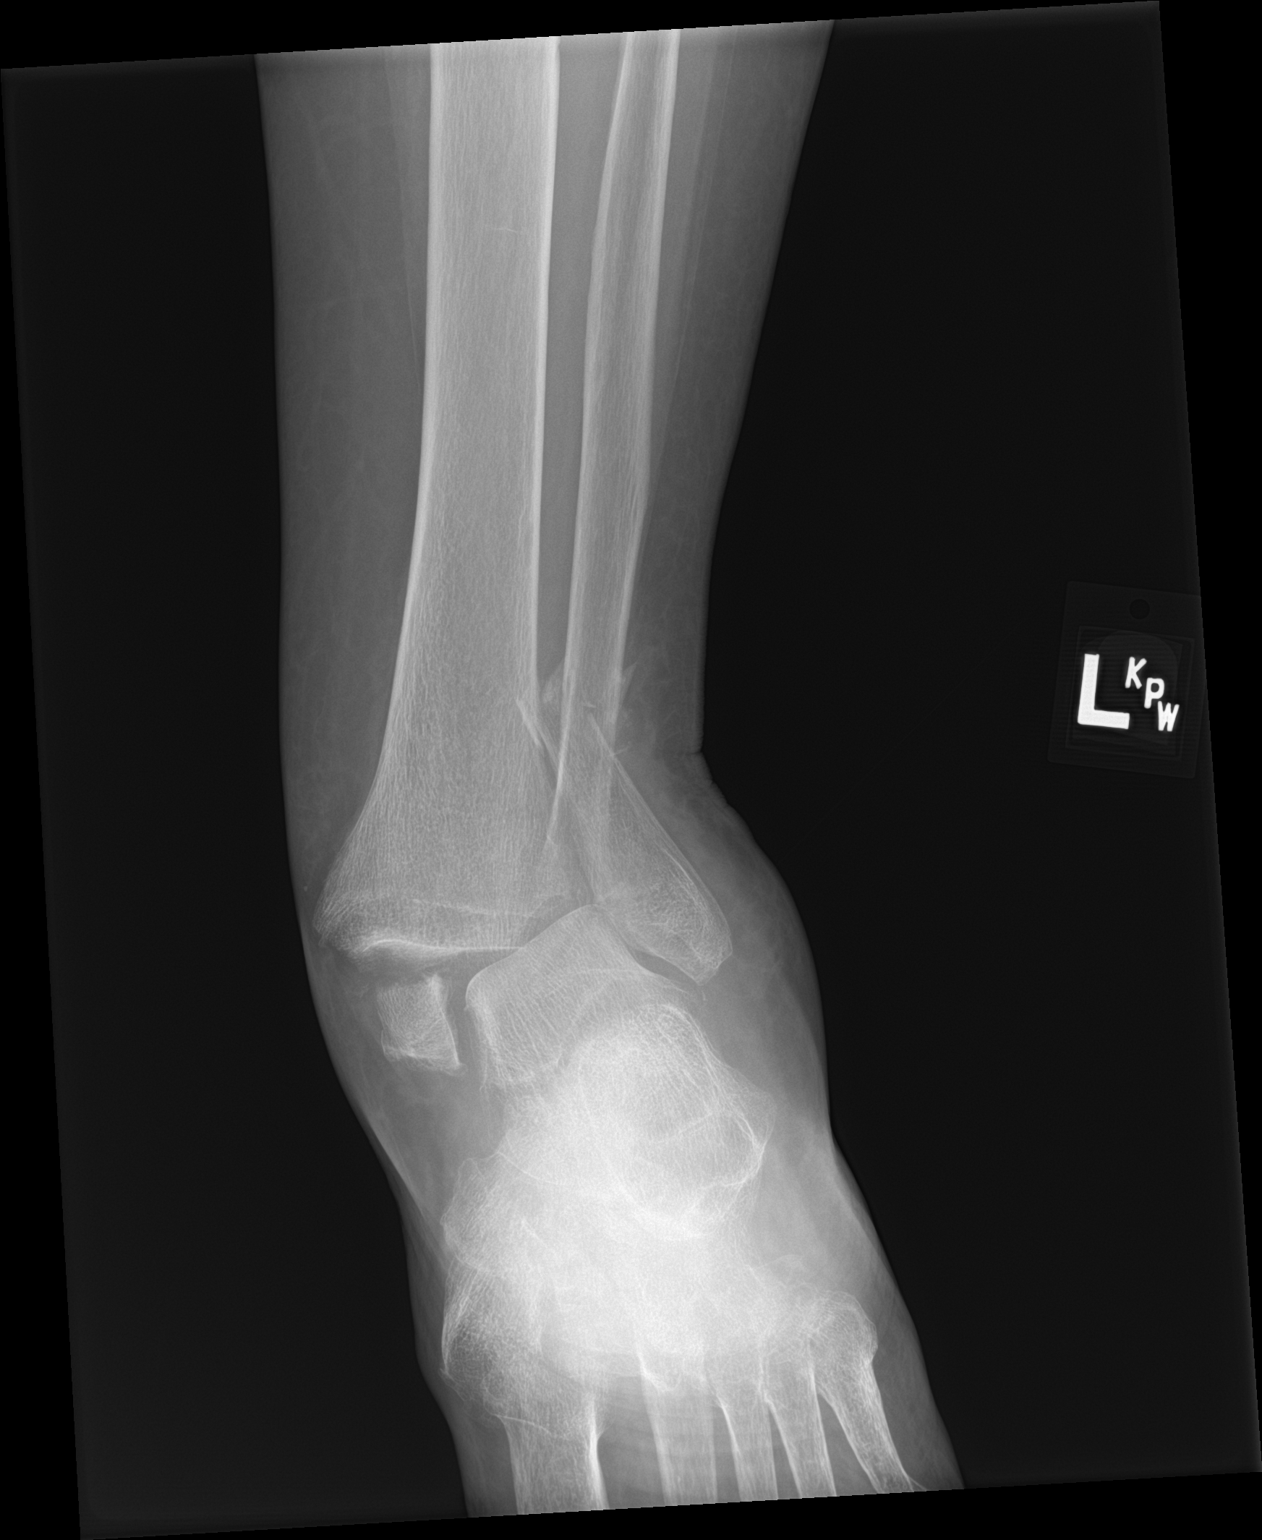

[ankle obl]
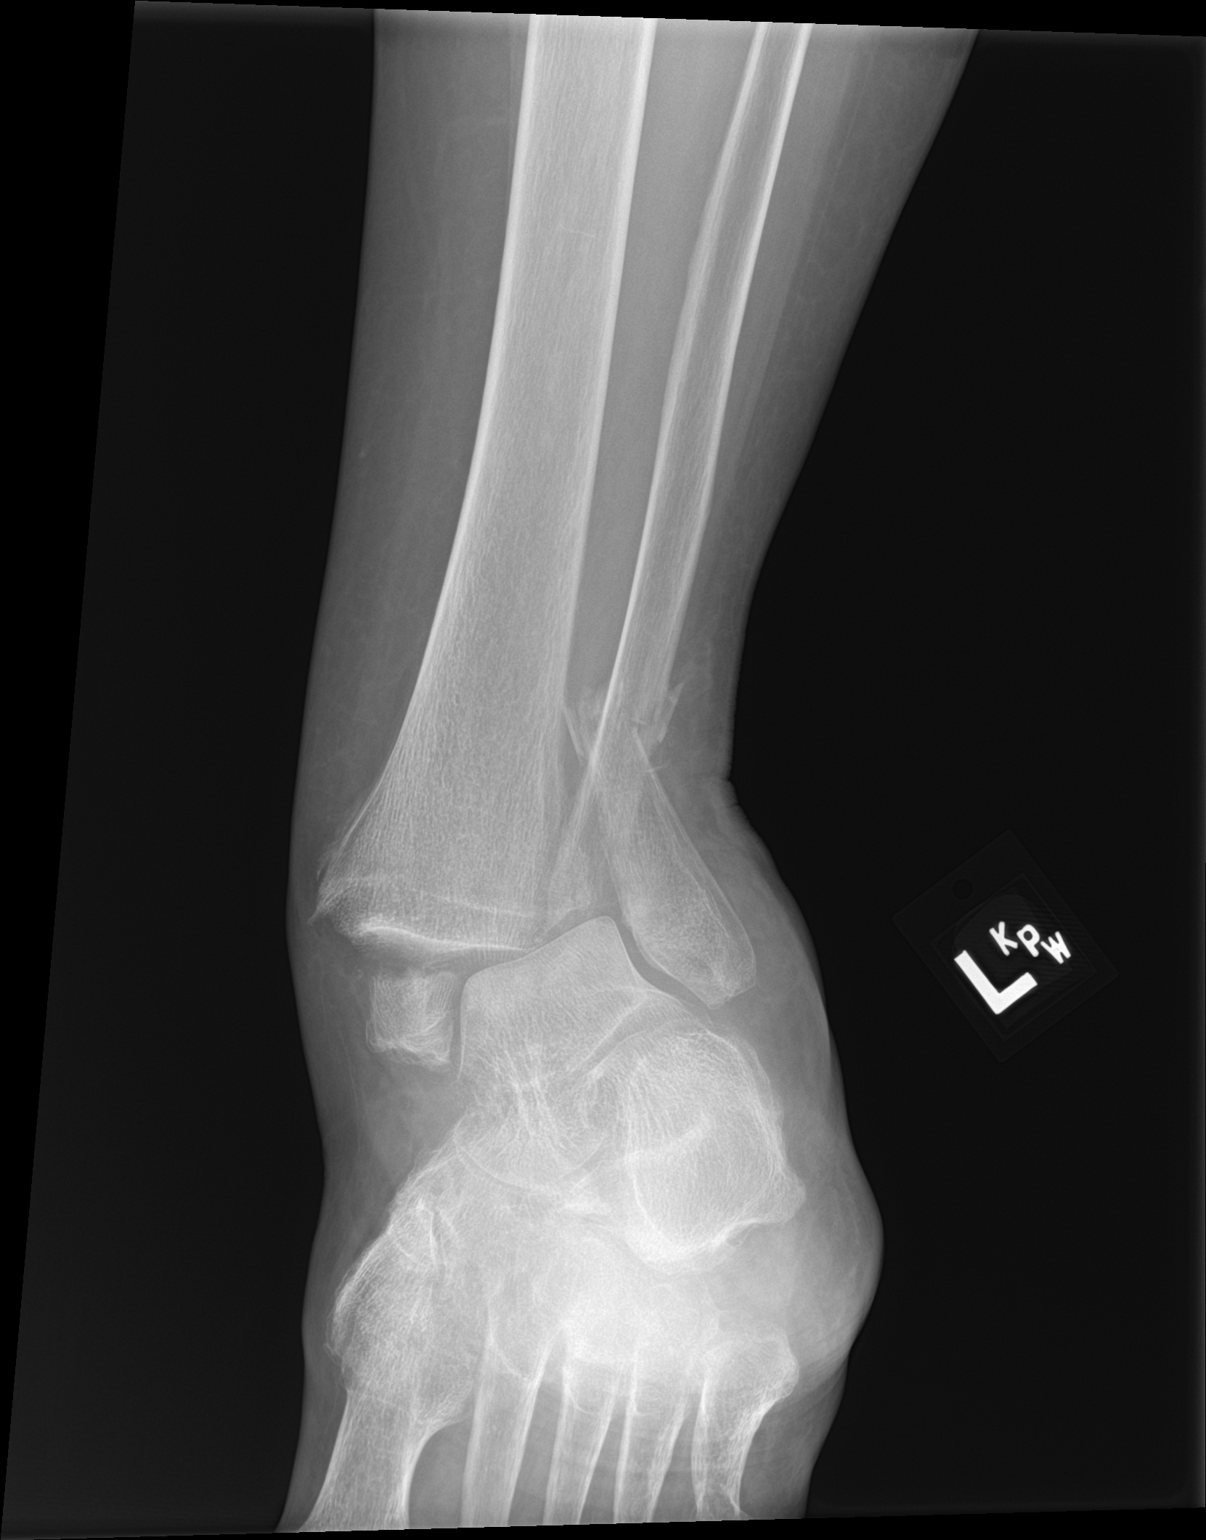

[ankle lat]
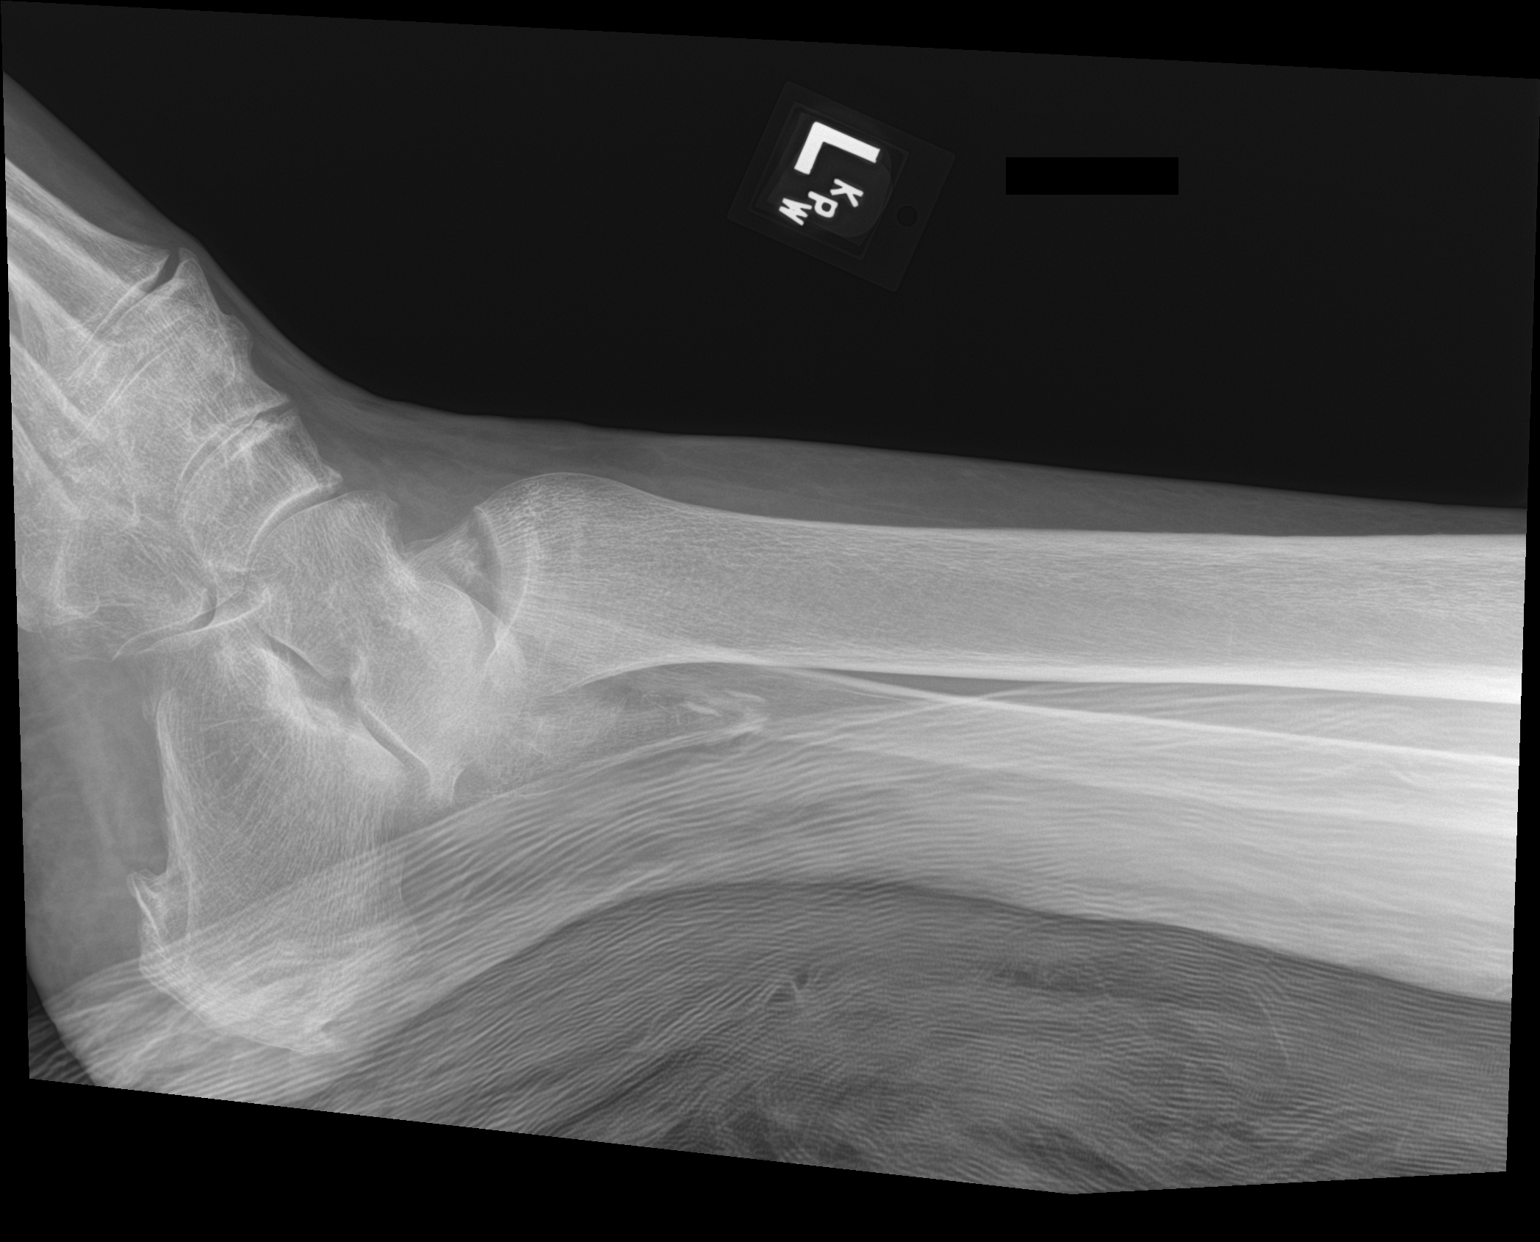

[3 of 3 positions shown; findings below may reference images not displayed]

FINDINGS: There is fracture dislocation at the left ankle. Distal fibular
metaphyseal fracture and medial malleolar fracture. Possible
posterior malleolar fracture although difficult to
determine/visualize. Lateral dislocation of the talus relative to
the tibia.
IMPRESSION: Left ankle fracture dislocation as above.

## 2021-10-29 IMAGING — DX DG ANKLE COMPLETE 3+V*L*
2 series · 2 of 2 positions shown · non-contrast
Comparison: None.

CLINICAL DATA: ORIF

EXAM:
LEFT ANKLE COMPLETE - 3+ VIEW

[ankle ap]
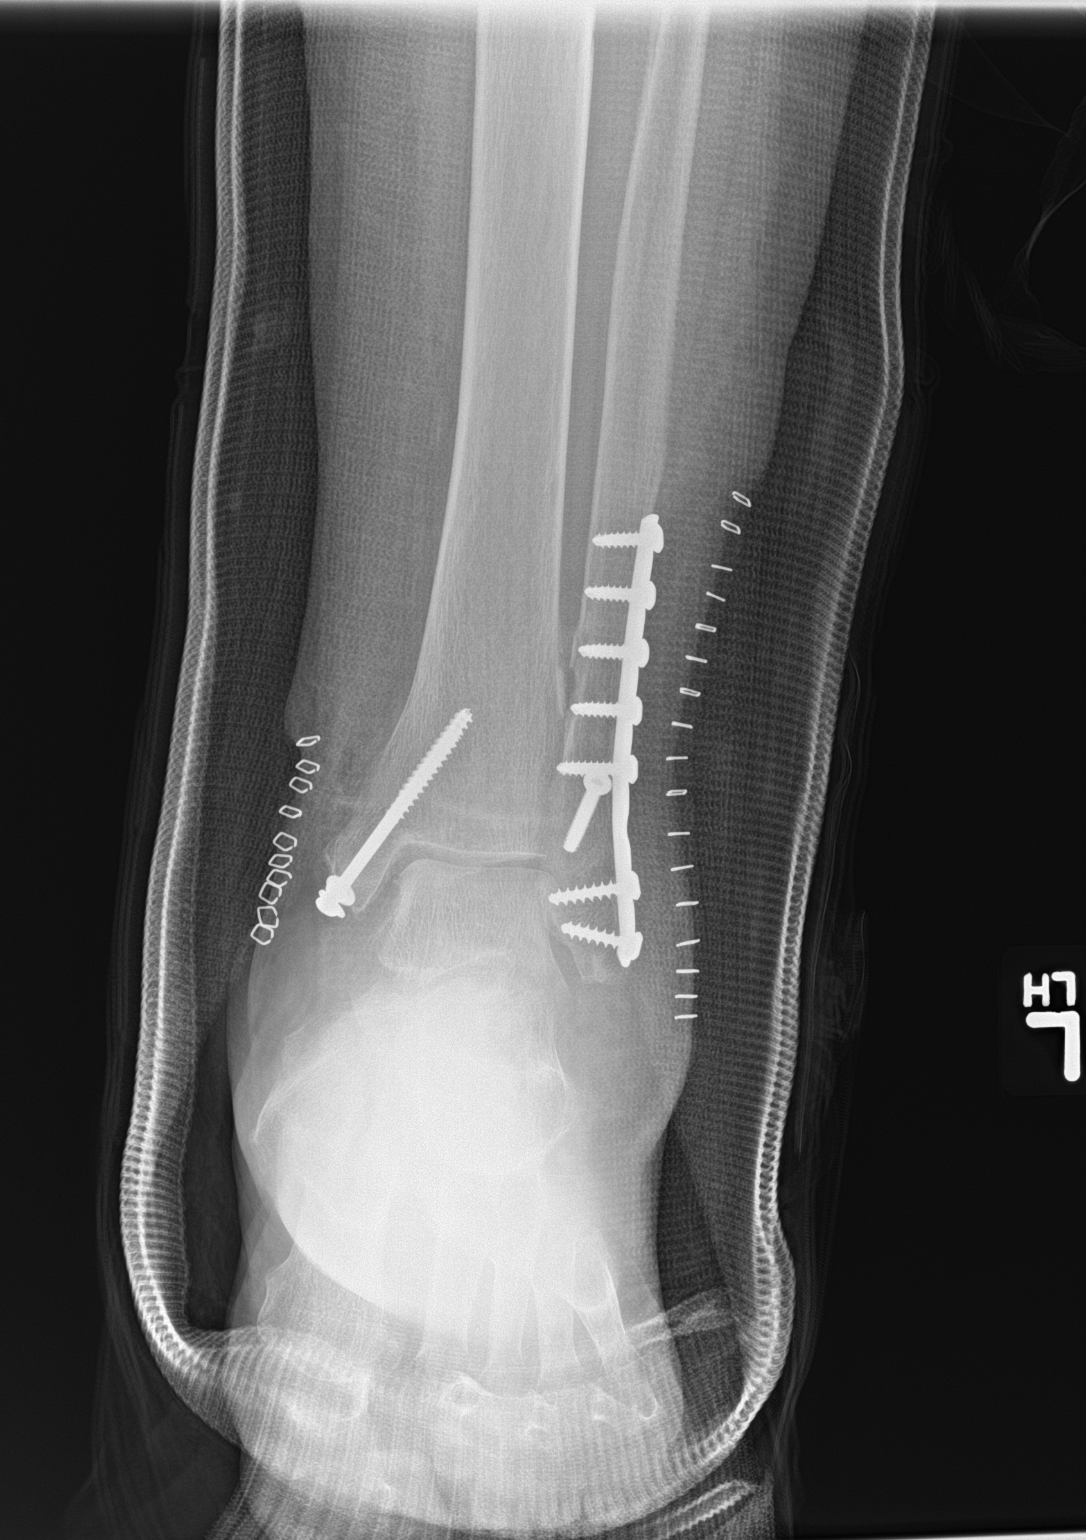

[ankle obl]
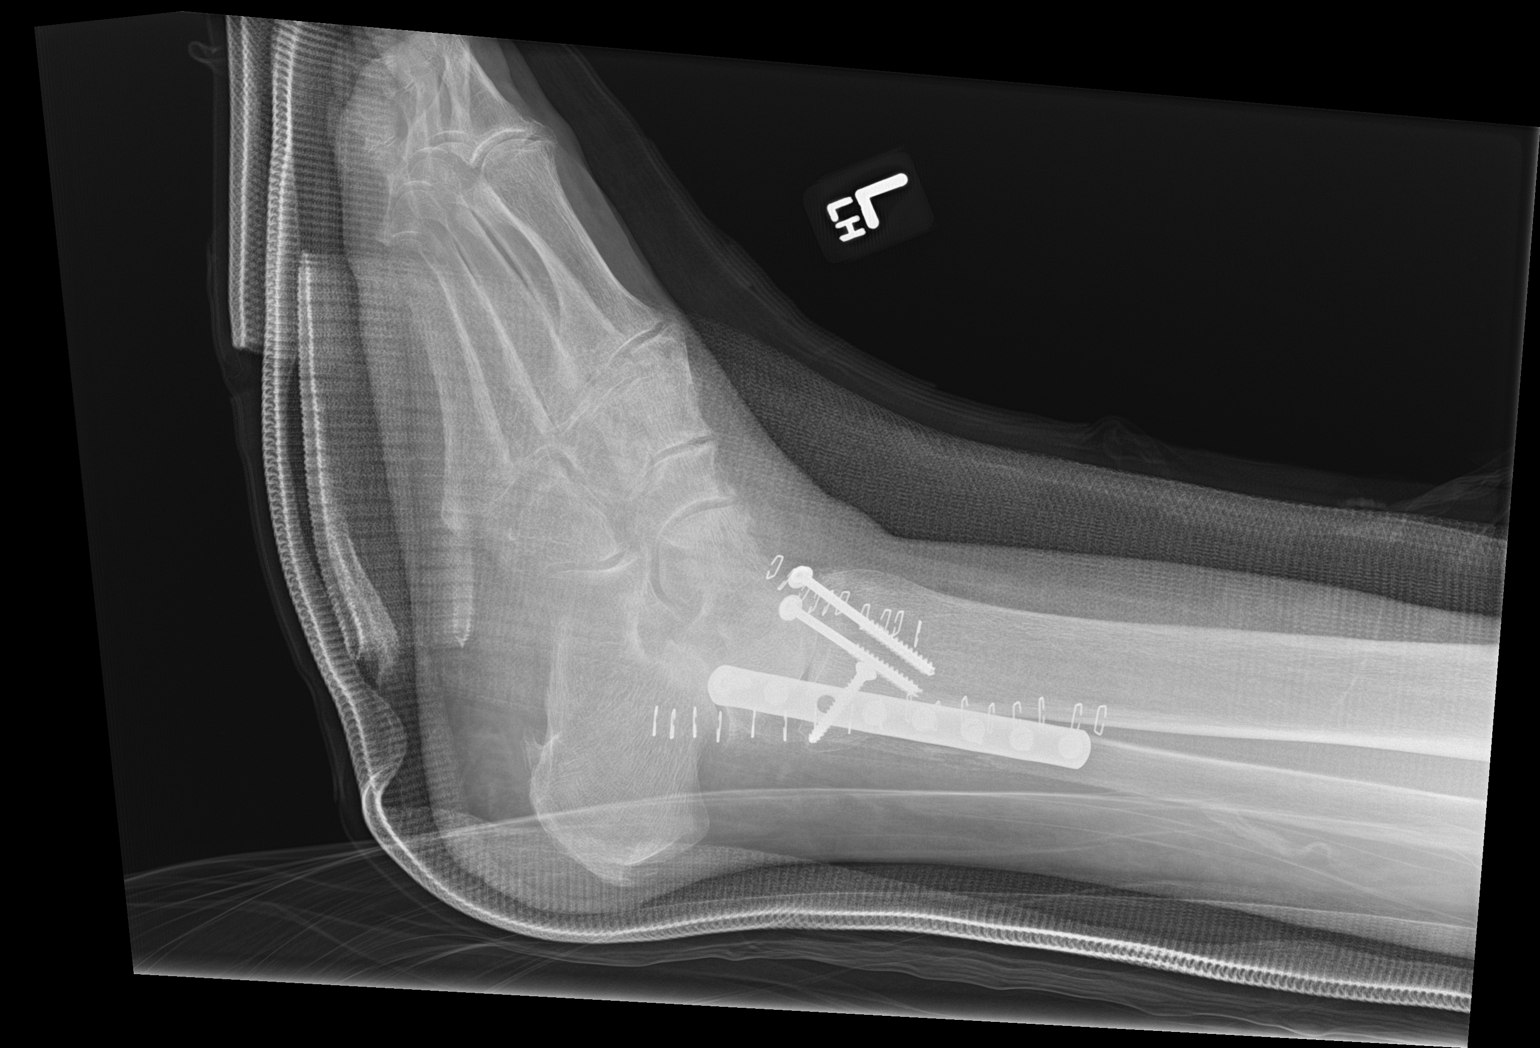

[2 of 2 positions shown; findings below may reference images not displayed]

FINDINGS: Plate and screw fixation within the distal fibula. Screw seen within
the medial malleolus. Anatomic alignment. No hardware complicating
feature.
IMPRESSION: Internal fixation.  No visible complicating feature.

## 2023-03-01 ENCOUNTER — Other Ambulatory Visit: Payer: Self-pay

## 2023-03-01 ENCOUNTER — Ambulatory Visit (INDEPENDENT_AMBULATORY_CARE_PROVIDER_SITE_OTHER): Payer: Medicare Other

## 2023-03-01 ENCOUNTER — Ambulatory Visit
Admission: EM | Admit: 2023-03-01 | Discharge: 2023-03-01 | Disposition: A | Discharge status: Home / Self Care | Payer: Medicare Other | Attending: Emergency Medicine | Admitting: Emergency Medicine

## 2023-03-01 DIAGNOSIS — S22000A Wedge compression fracture of unspecified thoracic vertebra, initial encounter for closed fracture: Secondary | ICD-10-CM

## 2023-03-01 NOTE — Discharge Instructions (Addendum)
Follow-up with [facility] spine specialist as previously planned for ongoing pain management.  Pick up the Miacalcin and tramadol previously prescribed by [facility] to use for back pain.  You may also purchase over-the-counter topical lidocaine patches and apply them to the areas of pain every 8 hours as needed.

## 2023-03-01 NOTE — ED Provider Notes (Signed)
[facility]    CSN: [id number] Arrival date & time: 03/01/23  1124      History   Chief Complaint Chief Complaint  Patient presents with   Back Pain    HPI Sarah Higgins is a 88 y.o. female.   HPI  88 year old female with a past medical history significant for hypertension, hyperlipidemia, GERD, syncope, chronic kidney disease stage II, and frequent falls presents for evaluation of low back pain.  She had a fall 1 week ago and was evaluated at [facility].  The family reports that she was told she had 2 spots on her low back and she would be in pain for 3 months.  They do have a Xerox copy of an x-ray with them that does show degeneration of her lumbar spine.  She has been using Tylenol at home without any improvement of symptoms.  Past Medical History:  Diagnosis Date   GERD (gastroesophageal reflux disease)    HLD (hyperlipidemia)    Hypertension     Patient Active Problem List   Diagnosis Date Noted   Hypokalemia 11/30/2020   Trimalleolar fracture of left ankle 11/29/2020   HLD (hyperlipidemia) 11/29/2020   Syncope 11/29/2020   Fall 11/29/2020   Hypomagnesemia 11/29/2020   Closed left ankle fracture 11/29/2020   CKD (chronic kidney disease), stage II 11/29/2020   GERD (gastroesophageal reflux disease) 11/29/2020   Leukocytosis 11/29/2020   HYPERLIPIDEMIA 01/07/2010   HTN (hypertension) 01/07/2010   UPPER RESPIRATORY INFECTION, ACUTE 01/07/2010    Past Surgical History:  Procedure Laterality Date   ABDOMINAL HYSTERECTOMY     ORIF ANKLE FRACTURE Left 11/30/2020   Procedure: OPEN REDUCTION INTERNAL FIXATION (ORIF) ANKLE FRACTURE;  Surgeon: Juanell Fairly, MD;  Location: [facility] ORS;  Service: Orthopedics;  Laterality: Left;    OB History   No obstetric history on file.      Home Medications    Prior to Admission medications   Medication Sig Start Date End Date Taking? Authorizing Provider  acetaminophen (TYLENOL) 325 MG tablet Take 2 tablets  (650 mg total) by mouth every 6 (six) hours as needed for fever, headache or moderate pain. 12/03/20  Yes Lurene Shadow, MD  benzonatate (TESSALON) 100 MG capsule Take 100 mg by mouth 3 (three) times daily as needed for cough.   Yes [provider]  fluticasone (FLONASE) 50 MCG/ACT nasal spray Place 1 spray into both nostrils daily.   Yes [provider]  gabapentin (NEURONTIN) 100 MG capsule Take 200 mg by mouth 3 (three) times daily.   Yes [provider]  latanoprost (XALATAN) 0.005 % ophthalmic solution Place 1 drop into both eyes at bedtime.   Yes [provider]  levocetirizine (XYZAL) 5 MG tablet Take 5 mg by mouth daily.   Yes [provider]  loperamide (IMODIUM) 2 MG capsule Take 4 mg by mouth 2 (two) times daily as needed for diarrhea or loose stools.   Yes [provider]  lovastatin (MEVACOR) 20 MG tablet Take 20 mg by mouth every evening.   Yes [provider]  omeprazole (PRILOSEC) 40 MG capsule Take 40 mg by mouth daily.   Yes [provider]  oxybutynin (DITROPAN-XL) 10 MG 24 hr tablet Take 10 mg by mouth daily.   Yes [provider]  polyethylene glycol (MIRALAX / GLYCOLAX) 17 g packet Take 17 g by mouth daily as needed for mild constipation. 12/03/20  Yes Lurene Shadow, MD  senna-docusate (SENOKOT-S) 8.6-50 MG tablet Take 1 tablet  by mouth at bedtime as needed for mild constipation. 12/03/20  Yes Lurene Shadow, MD  sodium chloride HYPERTONIC 3 % nebulizer solution Take 4 mLs by nebulization 2 (two) times daily.   Yes [provider]  venlafaxine XR (EFFEXOR-XR) 150 MG 24 hr capsule Take 150 mg by mouth daily.   Yes [provider]    Family History Family History  Problem Relation Age of Onset   Hypertension Mother    Hypertension Father     Social History Social History   Tobacco Use   Smoking status: Never   Smokeless tobacco: Never  Substance Use Topics   Alcohol use:  Not Currently   Drug use: Not Currently     Allergies   Patient has no known allergies.   Review of Systems Review of Systems  Musculoskeletal:  Positive for back pain.     Physical Exam Triage Vital Signs ED Triage Vitals  Encounter Vitals Group     BP 03/01/23 1147 105/60     Systolic BP Percentile --      Diastolic BP Percentile --      Pulse Rate 03/01/23 1147 63     Resp 03/01/23 1147 18     Temp 03/01/23 1147 98.1 F (36.7 C)     Temp src --      SpO2 03/01/23 1147 94 %     Weight --      Height --      Head Circumference --      Peak Flow --      Pain Score 03/01/23 1145 10     Pain Loc --      Pain Education --      Exclude from Growth Chart --    No data found.  Updated Vital Signs BP 105/60   Pulse 63   Temp 98.1 F (36.7 C)   Resp 18   SpO2 94%   Visual Acuity Right Eye Distance:   Left Eye Distance:   Bilateral Distance:    Right Eye Near:   Left Eye Near:    Bilateral Near:     Physical Exam Vitals and nursing note reviewed.  Constitutional:      Appearance: Normal appearance. She is not ill-appearing.  Musculoskeletal:        General: Tenderness present.  Skin:    General: Skin is warm and dry.     Capillary Refill: Capillary refill takes less than 2 seconds.     Findings: No bruising.  Neurological:     General: No focal deficit present.     Mental Status: She is alert and oriented to person, place, and time.      UC Treatments / Results  Labs (all labs ordered are listed, but only abnormal results are displayed) Labs Reviewed - No data to display  EKG   Radiology No results found.  Procedures Procedures (including critical care time)  Medications Ordered in UC Medications - No data to display  Initial Impression / Assessment and Plan / UC Course  I have reviewed the triage vital signs and the nursing notes.  Pertinent labs & imaging results that were available during my care of the patient were reviewed by me  and considered in my medical decision making (see chart for details).   Patient is a nontoxic-appearing 88 old female presenting for evaluation of low back pain after suffering a series of falls, the most recently 1 week ago.  I am unable to visualize the charts  from [facility].  She has been using over-the-counter Tylenol 650 mg every 6 hours for pain without any improvement of symptoms.  On exam she does have tenderness with palpation of her lumbar spine in the midline but no step-off.  No ecchymosis, edema, or erythema noted.  She is moving all extremities.  She was evaluated at [facility] following her last fall 1 week ago and had an MRI of her spine which shows acute on subacute compression fractures of multiple levels of her thoracic spine.  She was prescribed Miacalcin and tramadol and was referred to their spine specialist.  I have spoken with the patient's family and they report that the medication was not ready when they last went and they have not picked up the Miacalcin and tramadol.  I had ordered lumbar spine x-rays prior to being able to visualize the records in epic.  Plan is to discharge patient home to follow-up with the spine specialist at [facility] and use the Miacalcin and tramadol that was previously prescribed.  She can also use topical lidocaine patches.   Final Clinical Impressions(s) / UC Diagnoses   Final diagnoses:  Compression fracture of thoracic vertebra, unspecified thoracic vertebral level, initial encounter [facility])     Discharge Instructions      Follow-up with [facility] spine specialist as previously planned for ongoing pain management.  Pick up the Miacalcin and tramadol previously prescribed by [facility] to use for back pain.  You may also purchase over-the-counter topical lidocaine patches and apply them to the areas of pain every 8 hours as needed.     ED Prescriptions   None    PDMP not reviewed this encounter.   Becky Augusta, NP 03/01/23  1239

## 2023-03-01 NOTE — ED Triage Notes (Addendum)
Pt here c/o low back pain states she fell 2 wks ago seen at [facility] and is still having low back pain. Pt denies UTI symptoms

## 2023-05-02 NOTE — H&P (Signed)
 PRE-PROCEDURE HISTORY AND PHYSICAL EXAM  Sarah Higgins presents for her scheduled UGI ENDOSCOPY; WITH BIOPSY, SINGLE OR MULTIPLE.   The indication for the procedure(s) is Gastroesophageal reflux disease with esophagitis without hemorrhage [K21.00].   There have been no significant recent changes in the patient's medical status.  Past Medical History:  Diagnosis Date  . Arthritis   . Hypertension   . Reflux   . Visual impairment    Rx glasses   Past Surgical History:  Procedure Laterality Date  . COLONOSCOPY W/ BIOPSIES AND POLYPECTOMY    . HYSTERECTOMY    . PR COLONOSCOPY W/BIOPSY SINGLE/MULTIPLE  12/29/2012   Procedure: COLONOSCOPY, FLEXIBLE, PROXIMAL TO SPLENIC FLEXURE; WITH BIOPSY, SINGLE OR MULTIPLE;  Surgeon: Chauncey GORMAN Pfeiffer, MD;  Location: GI PROCEDURES [facility] [place];  Service: Gastroenterology  . PR COLONOSCOPY W/BIOPSY SINGLE/MULTIPLE N/A 04/19/2014   Procedure: COLONOSCOPY, FLEXIBLE, PROXIMAL TO SPLENIC FLEXURE; WITH BIOPSY, SINGLE OR MULTIPLE;  Surgeon: Chauncey GORMAN Pfeiffer, MD;  Location: [facility];  Service: Gastroenterology  . PR COLSC FLX W/RMVL OF TUMOR POLYP LESION SNARE TQ N/A 12/03/2016   Procedure: COLONOSCOPY FLEX; W/REMOV TUMOR/LES BY SNARE;  Surgeon: Dallas Jama Das, MD;  Location: [facility];  Service: Gastroenterology  . PR REPAIR OF HAMMERTOE,ONE Left 03/12/2015   Procedure: CORRECTION HAMMERTOE 3rd Left TOE;  Surgeon: Kayla Artist Setters, DPM;  Location: [facility];  Service: Vascular  . PR UPPER GI ENDOSCOPY,DIAGNOSIS N/A 12/03/2016   Procedure: UGI ENDO, INCLUDE ESOPHAGUS, STOMACH, & DUODENUM &/OR JEJUNUM; DX W/WO COLLECTION SPECIMN, BY BRUSH OR WASH;  Surgeon: Dallas Jama Das, MD;  Location: [facility];  Service: Gastroenterology  . SINUS SURGERY      Allergies Allergies  Allergen Reactions  . Grass Pollen-June Grass Standard     Medications FLUoxetine , albuterol , alendronate , ascorbic acid  (vitamin C),  benzonatate , buPROPion , calcitonin (salmon), carbamide peroxide, (cholecalciferol (vitamin D3-50 mcg (2,000 unit))), esomeprazole , famotidine , fluticasone  propionate, gabapentin , inhalational spacing device, latanoprost , levocetirizine, lovastatin , mupirocin , oxybutynin , sodium chloride , traMADol , triamcinolone, and vibegron  Physical Examination There were no vitals filed for this visit. There is no height or weight on file to calculate BMI.  General:  Well-appearing, conversant, NAD   Mental Status: AAOx3, thoughts organized   Lungs: Bilateral chest expansion, no wheeze, unlabored breathing      Abdomen: Soft, non-tender, non-distended   Neuro:  Grossly intact       ASSESSMENT AND PLAN Sarah Higgins has been evaluated and deemed appropriate to undergo the planned UGI ENDOSCOPY; WITH BIOPSY, SINGLE OR MULTIPLE.  The patient, or her authorized representative, was provided a printed handout that explained the nature and benefits of the procedure(s), the most frequent risks, and alternatives, if any.  I personally reviewed this information with the patient, or her authorized representative, and answered all questions.  Risks/benefits discussed today, with risks including but not limited to infection, bleeding (including life threatening bleeding which might require blood transfusion), missed lesion, tear/perforation (which might require surgical intervention in the operating room) and if needing to proceed with pancreatic biopsy or ERCP, pancreatitis (which is often mild but can be more severe, even life threatening) or duct leak.   Sarah FORBES Budd, MD  [facility]

## 2023-10-04 ENCOUNTER — Emergency Department
Admission: EM | Admit: 2023-10-04 | Discharge: 2023-10-04 | Disposition: A | Discharge status: Home / Self Care | Attending: Emergency Medicine | Admitting: Emergency Medicine

## 2023-10-04 ENCOUNTER — Emergency Department

## 2023-10-04 ENCOUNTER — Encounter: Payer: Self-pay | Admitting: Emergency Medicine

## 2023-10-04 ENCOUNTER — Other Ambulatory Visit: Payer: Self-pay

## 2023-10-04 DIAGNOSIS — W19XXXA Unspecified fall, initial encounter: Secondary | ICD-10-CM | POA: Diagnosis not present

## 2023-10-04 DIAGNOSIS — S0990XA Unspecified injury of head, initial encounter: Secondary | ICD-10-CM

## 2023-10-04 DIAGNOSIS — I10 Essential (primary) hypertension: Secondary | ICD-10-CM | POA: Diagnosis not present

## 2023-10-04 DIAGNOSIS — S0181XA Laceration without foreign body of other part of head, initial encounter: Secondary | ICD-10-CM | POA: Insufficient documentation

## 2023-10-04 MED ORDER — LIDOCAINE-EPINEPHRINE-TETRACAINE (LET) TOPICAL GEL
3.0000 mL | Freq: Once | TOPICAL | Status: AC
  Administered 2023-10-04: 3 mL via TOPICAL
  Filled 2023-10-04: qty 3

## 2023-10-04 MED ORDER — LIDOCAINE HCL (PF) 1 % IJ SOLN
5.0000 mL | Freq: Once | INTRAMUSCULAR | Status: AC
  Administered 2023-10-04: 5 mL via INTRADERMAL
  Filled 2023-10-04: qty 5

## 2023-10-04 NOTE — ED Triage Notes (Signed)
 Patient to ED via POV from [facility] for laceration above left eyebrow. PT tripped and fell at the nail salon hitting head on desk. PT reports LOC but no blood thinners. Bleeding controlled at this time.

## 2023-10-04 NOTE — ED Provider Notes (Signed)
 [facility] Provider Note    Event Date/Time   First MD Initiated Contact with Patient 10/04/23 1229     (approximate)   History   Head Laceration   HPI  Sarah Higgins is a 89 y.o. female with history of hypertension, hyperlipidemia, GERD presents emergency department after a fall earlier today.  Sent from [place] [facility].  Patient went to the nail salon and states she just fell.  Family member states she does fall a lot.  No neck pain, no loss consciousness      Physical Exam   Triage Vital Signs: ED Triage Vitals  Encounter Vitals Group     BP 10/04/23 1152 126/75     Systolic BP Percentile --      Diastolic BP Percentile --      Pulse Rate 10/04/23 1152 (!) 58     Resp 10/04/23 1152 18     Temp 10/04/23 1152 (!) 97.5 F (36.4 C)     Temp Source 10/04/23 1152 Axillary     SpO2 10/04/23 1152 94 %     Weight 10/04/23 1150 187 lb 6.3 oz (85 kg)     Height 10/04/23 1150 5\' 7"  (1.702 m)     Head Circumference --      Peak Flow --      Pain Score --      Pain Loc --      Pain Education --      Exclude from Growth Chart --     Most recent vital signs: Vitals:   10/04/23 1152  BP: 126/75  Pulse: (!) 58  Resp: 18  Temp: (!) 97.5 F (36.4 C)  SpO2: 94%     General: Awake, no distress.   CV:  Good peripheral perfusion. regular rate and  rhythm Resp:  Normal effort.  Abd:  No distention.   Other:  Left brow with large lacerations noted, no foreign body, no active bleeding at this time   ED Results / Procedures / Treatments   Labs (all labs ordered are listed, but only abnormal results are displayed) Labs Reviewed - No data to display   EKG     RADIOLOGY CT of the head and cervical spine    PROCEDURES:   .Laceration Repair  Date/Time: 10/04/2023 2:27 PM  Performed by: Faythe Ghee, PA-C Authorized by: Faythe Ghee, PA-C   Consent:    Consent obtained:  Verbal   Consent given by:  Patient   Risks, benefits,  and alternatives were discussed: yes     Risks discussed:  Infection, pain, retained foreign body, tendon damage, poor cosmetic result, need for additional repair, nerve damage, poor wound healing and vascular damage   Alternatives discussed:  Delayed treatment Universal protocol:    Procedure explained and questions answered to patient or proxy's satisfaction: yes     Patient identity confirmed:  Verbally with patient Anesthesia:    Anesthesia method:  Topical application and local infiltration   Topical anesthetic:  LET   Local anesthetic:  Lidocaine 1% w/o epi Laceration details:    Location:  Face   Face location:  L eyebrow   Length (cm):  3.5 Pre-procedure details:    Preparation:  Patient was prepped and draped in usual sterile fashion and imaging obtained to evaluate for foreign bodies Exploration:    Limited defect created (wound extended): no     Hemostasis achieved with:  Direct pressure and LET   Imaging outcome: foreign body  not noted     Wound exploration: wound explored through full range of motion and entire depth of wound visualized     Wound extent: areolar tissue not violated, fascia not violated, no foreign body, no signs of injury, no nerve damage, no tendon damage, no underlying fracture and no vascular damage     Contaminated: no   Treatment:    Area cleansed with:  Povidone-iodine and saline   Amount of cleaning:  Standard   Irrigation solution:  Sterile saline   Irrigation method:  Tap   Debridement:  None   Undermining:  None   Layers/structures repaired:  Deep subcutaneous Deep subcutaneous:    Suture size:  6-0   Suture material:  Vicryl   Suture technique:  Simple interrupted   Number of sutures:  5 Skin repair:    Repair method:  Sutures   Suture size:  5-0   Suture material:  Nylon   Suture technique:  Simple interrupted   Number of sutures:  10 Approximation:    Approximation:  Close Repair type:    Repair type:   Intermediate Post-procedure details:    Dressing:  Non-adherent dressing   Procedure completion:  Tolerated well, no immediate complications  Chief Complaint  Patient presents with   Head Laceration      MEDICATIONS ORDERED IN ED: Medications  lidocaine (PF) (XYLOCAINE) 1 % injection 5 mL (has no administration in time range)  lidocaine-EPINEPHrine-tetracaine (LET) topical gel (3 mLs Topical Given 10/04/23 1311)     IMPRESSION / MDM / ASSESSMENT AND PLAN / ED COURSE  I reviewed the triage vital signs and the nursing notes.                              Differential diagnosis includes, but is not limited to, subdural, SAH, CVA, fracture, contusion, laceration  Patient's presentation is most consistent with acute illness / injury with system symptoms.   CT of the head cervical spine independently reviewed interpreted by me as being negative for any acute abnormality  Radiologist does comment that there may be a new bronchial pneumonia noted from the CT of the C-spine, however patient has been afebrile said no cough or any trouble breathing.  Therefore I do not feel that the patient has pneumonia  See procedure note for laceration repair.  Patient tolerated procedure well Tdap is up-to-date as it was done within a year.  I did explain all the findings to the patient and her family member.  Suture care instructions were discussed.  Return precautions discussed.  They should have sutures removed in 1 week either at her regular doctor, urgent care, or return emergency department.  They are in agreement treatment plan.  Patient was discharged stable condition.      FINAL CLINICAL IMPRESSION(S) / ED DIAGNOSES   Final diagnoses:  Facial laceration, initial encounter  Minor head injury, initial encounter  Fall, initial encounter     Rx / DC Orders   ED Discharge Orders     None        Note:  This document was prepared using Dragon voice recognition software and may  include unintentional dictation errors.    Faythe Ghee, PA-C 10/04/23 1430    Trinna Post, MD 10/04/23 [phone]

## 2023-10-04 NOTE — ED Notes (Signed)
 Laceration dressed with nonadhesive dressing and then wrapped with rolled gauze

## 2023-10-04 NOTE — Discharge Instructions (Signed)
 Follow-up with your regular doctor for suture removal in 1 week.  May also go to urgent care.  Keep the areas clean and dry as possible.  Wash daily with some very mild soap and water.  Try to leave the bandage on for 24 hours.  Return if worsening

## 2023-10-14 ENCOUNTER — Ambulatory Visit: Admission: EM | Admit: 2023-10-14 | Discharge: 2023-10-14 | Discharge status: Against Medical Advice

## 2023-10-14 ENCOUNTER — Ambulatory Visit: Admission: EM | Admit: 2023-10-14 | Discharge: 2023-10-14 | Disposition: A | Discharge status: Home / Self Care

## 2023-10-14 DIAGNOSIS — S0181XD Laceration without foreign body of other part of head, subsequent encounter: Secondary | ICD-10-CM | POA: Diagnosis not present

## 2023-10-14 DIAGNOSIS — Z4802 Encounter for removal of sutures: Secondary | ICD-10-CM

## 2023-10-14 NOTE — Discharge Instructions (Signed)
 See the attached information on laceration care after suture removal.

## 2023-10-14 NOTE — ED Provider Notes (Signed)
 Sarah Higgins    CSN: [id number] Arrival date & time: 10/14/23  1614      History   Chief Complaint Chief Complaint  Patient presents with   Suture / Staple Removal    HPI Sarah Higgins is a 89 y.o. female.  Patient presents for removal of sutures.  She was seen in at [facility] ED on 10/04/2023 for facial laceration, minor head injury, fall; 5 Vicryl sutures and 10 nylon sutures placed.  Patient reports the wound has been healing well.  No fever, redness, purulent drainage.  The history is provided by the patient and medical records.    Past Medical History:  Diagnosis Date   GERD (gastroesophageal reflux disease)    HLD (hyperlipidemia)    Hypertension     Patient Active Problem List   Diagnosis Date Noted   Hypokalemia 11/30/2020   Trimalleolar fracture of left ankle 11/29/2020   HLD (hyperlipidemia) 11/29/2020   Syncope 11/29/2020   Fall 11/29/2020   Hypomagnesemia 11/29/2020   Closed left ankle fracture 11/29/2020   CKD (chronic kidney disease), stage II 11/29/2020   GERD (gastroesophageal reflux disease) 11/29/2020   Leukocytosis 11/29/2020   HYPERLIPIDEMIA 01/07/2010   HTN (hypertension) 01/07/2010   Acute upper respiratory infection 01/07/2010    Past Surgical History:  Procedure Laterality Date   ABDOMINAL HYSTERECTOMY     ORIF ANKLE FRACTURE Left 11/30/2020   Procedure: OPEN REDUCTION INTERNAL FIXATION (ORIF) ANKLE FRACTURE;  Surgeon: Juanell Fairly, MD;  Location: [facility] ORS;  Service: Orthopedics;  Laterality: Left;    OB History   No obstetric history on file.      Home Medications    Prior to Admission medications   Medication Sig Start Date End Date Taking? Authorizing Provider  acetaminophen (TYLENOL) 325 MG tablet Take 2 tablets (650 mg total) by mouth every 6 (six) hours as needed for fever, headache or moderate pain. 12/03/20   Lurene Shadow, MD  benzonatate (TESSALON) 100 MG capsule Take 100 mg by mouth 3 (three) times daily as  needed for cough.    [provider]  fluticasone (FLONASE) 50 MCG/ACT nasal spray Place 1 spray into both nostrils daily.    [provider]  gabapentin (NEURONTIN) 100 MG capsule Take 200 mg by mouth 3 (three) times daily.    [provider]  latanoprost (XALATAN) 0.005 % ophthalmic solution Place 1 drop into both eyes at bedtime.    [provider]  levocetirizine (XYZAL) 5 MG tablet Take 5 mg by mouth daily.    [provider]  loperamide (IMODIUM) 2 MG capsule Take 4 mg by mouth 2 (two) times daily as needed for diarrhea or loose stools.    [provider]  lovastatin (MEVACOR) 20 MG tablet Take 20 mg by mouth every evening.    [provider]  omeprazole (PRILOSEC) 40 MG capsule Take 40 mg by mouth daily.    [provider]  oxybutynin (DITROPAN-XL) 10 MG 24 hr tablet Take 10 mg by mouth daily.    [provider]  polyethylene glycol (MIRALAX / GLYCOLAX) 17 g packet Take 17 g by mouth daily as needed for mild constipation. 12/03/20   Lurene Shadow, MD  senna-docusate (SENOKOT-S) 8.6-50 MG tablet Take 1 tablet by mouth at bedtime as needed for mild constipation. 12/03/20   Lurene Shadow, MD  sodium chloride HYPERTONIC 3 % nebulizer solution Take 4 mLs by nebulization 2 (two) times daily.    [provider]  venlafaxine XR (  EFFEXOR-XR) 150 MG 24 hr capsule Take 150 mg by mouth daily.    [provider]    Family History Family History  Problem Relation Age of Onset   Hypertension Mother    Hypertension Father     Social History Social History   Tobacco Use   Smoking status: Never   Smokeless tobacco: Never  Vaping Use   Vaping status: Never Used  Substance Use Topics   Alcohol use: Not Currently   Drug use: Not Currently     Allergies   Patient has no known allergies.   Review of Systems Review of Systems  Constitutional:  Negative for chills and fever.  Skin:  Positive  for wound. Negative for color change.     Physical Exam Triage Vital Signs ED Triage Vitals  Encounter Vitals Group     BP 10/14/23 1630 109/71     Systolic BP Percentile --      Diastolic BP Percentile --      Pulse Rate 10/14/23 1625 69     Resp 10/14/23 1625 19     Temp 10/14/23 1625 97.9 F (36.6 C)     Temp src --      SpO2 10/14/23 1625 97 %     Weight --      Height --      Head Circumference --      Peak Flow --      Pain Score --      Pain Loc --      Pain Education --      Exclude from Growth Chart --    No data found.  Updated Vital Signs BP 109/71   Pulse 69   Temp 97.9 F (36.6 C)   Resp 19   SpO2 97%   Visual Acuity Right Eye Distance:   Left Eye Distance:   Bilateral Distance:    Right Eye Near:   Left Eye Near:    Bilateral Near:     Physical Exam Constitutional:      General: She is not in acute distress. HENT:     Mouth/Throat:     Mouth: Mucous membranes are moist.  Cardiovascular:     Rate and Rhythm: Normal rate and regular rhythm.  Pulmonary:     Effort: Pulmonary effort is normal. No respiratory distress.  Skin:    General: Skin is warm and dry.     Findings: Lesion present. No erythema.     Comments: Well-healing sutured laceration on left forehead at eyebrow.  No erythema or drainage.  Neurological:     Mental Status: She is alert.      UC Treatments / Results  Labs (all labs ordered are listed, but only abnormal results are displayed) Labs Reviewed - No data to display  EKG   Radiology No results found.  Procedures Procedures (including critical care time)  Medications Ordered in UC Medications - No data to display  Initial Impression / Assessment and Plan / UC Course  I have reviewed the triage vital signs and the nursing notes.  Pertinent labs & imaging results that were available during my care of the patient were reviewed by me and considered in my medical decision making (see chart for details).     Subsequent encounter for facial laceration, visit for suture removal.  Sutures removed by RN.  The wound appears to be healing well.  Education provided on laceration care after suture removal.  Instructed patient to follow-up if she has  any concerns.  She agrees to plan of care.  Final Clinical Impressions(s) / UC Diagnoses   Final diagnoses:  Facial laceration, subsequent encounter  Visit for suture removal     Discharge Instructions      See the attached information on laceration care after suture removal.     ED Prescriptions   None    PDMP not reviewed this encounter.   Mickie Bail, NP 10/14/23 [phone]

## 2023-11-11 ENCOUNTER — Other Ambulatory Visit (HOSPITAL_COMMUNITY): Payer: Self-pay

## 2023-11-17 ENCOUNTER — Other Ambulatory Visit (HOSPITAL_COMMUNITY): Payer: Self-pay

## 2023-11-18 ENCOUNTER — Other Ambulatory Visit (HOSPITAL_COMMUNITY): Payer: Self-pay

## 2023-11-18 ENCOUNTER — Other Ambulatory Visit: Payer: Self-pay

## 2023-11-18 MED ORDER — ALENDRONATE SODIUM 70 MG PO TABS
70.0000 mg | ORAL_TABLET | ORAL | 3 refills | Status: DC
  Filled 2023-11-18: qty 12, 84d supply, fill #0

## 2023-11-18 MED ORDER — FLUOXETINE HCL 40 MG PO CAPS
40.0000 mg | ORAL_CAPSULE | Freq: Every day | ORAL | 4 refills | Status: DC
  Filled 2023-11-18: qty 100, 100d supply, fill #0

## 2023-11-18 MED ORDER — BUPROPION HCL ER (XL) 150 MG PO TB24
150.0000 mg | ORAL_TABLET | Freq: Every morning | ORAL | 3 refills | Status: AC
  Filled 2023-11-18: qty 90, 90d supply, fill #0
  Filled 2024-04-19: qty 90, 90d supply, fill #1
  Filled 2024-05-01: qty 90, 90d supply, fill #2
  Filled 2024-05-28: qty 30, 30d supply, fill #2

## 2023-11-18 MED ORDER — ALBUTEROL SULFATE HFA 108 (90 BASE) MCG/ACT IN AERS
2.0000 | INHALATION_SPRAY | Freq: Two times a day (BID) | RESPIRATORY_TRACT | 2 refills | Status: AC
Start: 2023-11-18 — End: ?
  Filled 2023-11-18: qty 6.7, 30d supply, fill #0

## 2023-11-18 MED ORDER — GABAPENTIN 300 MG PO CAPS
300.0000 mg | ORAL_CAPSULE | Freq: Every evening | ORAL | 3 refills | Status: AC
  Filled 2023-11-18: qty 100, 100d supply, fill #0
  Filled 2024-05-01: qty 30, 30d supply, fill #1
  Filled 2024-05-28: qty 30, 30d supply, fill #2
  Filled 2024-06-22: qty 30, 30d supply, fill #3
  Filled 2024-07-23: qty 30, 30d supply, fill #4

## 2023-11-18 MED ORDER — ASCORBIC ACID 500 MG PO TABS
500.0000 mg | ORAL_TABLET | Freq: Every day | ORAL | 3 refills | Status: AC
  Filled 2023-11-18: qty 90, 90d supply, fill #0
  Filled 2024-04-05: qty 90, 90d supply, fill #1
  Filled 2024-07-23: qty 30, 30d supply, fill #2

## 2023-11-18 MED ORDER — OXYBUTYNIN CHLORIDE ER 10 MG PO TB24
10.0000 mg | ORAL_TABLET | Freq: Every day | ORAL | 3 refills | Status: DC
Start: 2023-11-18 — End: 2024-01-20
  Filled 2023-11-18: qty 100, 100d supply, fill #0

## 2023-11-18 MED ORDER — LOVASTATIN 20 MG PO TABS
20.0000 mg | ORAL_TABLET | Freq: Every day | ORAL | 3 refills | Status: AC
  Filled 2023-11-18: qty 90, 90d supply, fill #0
  Filled 2024-05-24: qty 90, 90d supply, fill #1
  Filled 2024-05-28: qty 30, 30d supply, fill #1
  Filled 2024-06-22: qty 30, 30d supply, fill #2
  Filled 2024-07-23: qty 30, 30d supply, fill #3

## 2023-11-18 MED ORDER — ESOMEPRAZOLE MAGNESIUM 40 MG PO CPDR
40.0000 mg | DELAYED_RELEASE_CAPSULE | Freq: Every day | ORAL | 1 refills | Status: DC
  Filled 2023-11-18: qty 90, 90d supply, fill #0
  Filled 2024-04-19: qty 90, 90d supply, fill #1

## 2023-11-18 MED ORDER — BENZONATATE 100 MG PO CAPS
100.0000 mg | ORAL_CAPSULE | Freq: Three times a day (TID) | ORAL | 3 refills | Status: DC | PRN
Start: 2023-11-18 — End: 2024-04-30
  Filled 2023-11-18: qty 90, 30d supply, fill #0
  Filled 2024-02-16 (×2): qty 90, 30d supply, fill #1
  Filled 2024-03-28: qty 90, 30d supply, fill #2
  Filled 2024-04-19: qty 90, 30d supply, fill #3

## 2023-11-18 MED ORDER — FLUTICASONE PROPIONATE 50 MCG/ACT NA SUSP
1.0000 | Freq: Every day | NASAL | 5 refills | Status: AC
Start: 2023-11-18 — End: ?
  Filled 2023-11-18: qty 16, 60d supply, fill #0

## 2023-11-18 MED ORDER — GABAPENTIN 100 MG PO CAPS
100.0000 mg | ORAL_CAPSULE | Freq: Two times a day (BID) | ORAL | 3 refills | Status: AC
  Filled 2023-11-18: qty 180, 90d supply, fill #0

## 2023-11-18 MED ORDER — FAMOTIDINE 40 MG PO TABS
40.0000 mg | ORAL_TABLET | Freq: Every day | ORAL | 2 refills | Status: AC
  Filled 2023-11-18: qty 100, 100d supply, fill #0
  Filled 2024-04-03: qty 100, 100d supply, fill #1
  Filled 2024-05-28: qty 30, 30d supply, fill #2
  Filled 2024-06-01 – 2024-06-04 (×2): qty 100, 100d supply, fill #2
  Filled 2024-07-26: qty 100, 100d supply, fill #3

## 2023-11-18 MED ORDER — LEVOCETIRIZINE DIHYDROCHLORIDE 5 MG PO TABS
5.0000 mg | ORAL_TABLET | Freq: Every evening | ORAL | 3 refills | Status: AC
  Filled 2023-11-18: qty 90, 90d supply, fill #0
  Filled 2024-05-03: qty 90, 90d supply, fill #1
  Filled 2024-05-03: qty 30, 30d supply, fill #1
  Filled 2024-05-28: qty 30, 30d supply, fill #2
  Filled 2024-06-22: qty 30, 30d supply, fill #3
  Filled 2024-07-23: qty 30, 30d supply, fill #4

## 2023-11-22 ENCOUNTER — Other Ambulatory Visit (HOSPITAL_COMMUNITY): Payer: Self-pay

## 2024-01-17 ENCOUNTER — Other Ambulatory Visit: Payer: Self-pay

## 2024-01-17 ENCOUNTER — Emergency Department

## 2024-01-17 ENCOUNTER — Inpatient Hospital Stay: Admitting: Certified Registered"

## 2024-01-17 ENCOUNTER — Encounter: Payer: Self-pay | Admitting: Internal Medicine

## 2024-01-17 ENCOUNTER — Encounter
Admission: EM | Disposition: A | Discharge status: Home Health | Payer: Self-pay | Source: Home / Self Care | Attending: Osteopathic Medicine

## 2024-01-17 ENCOUNTER — Inpatient Hospital Stay

## 2024-01-17 ENCOUNTER — Inpatient Hospital Stay
Admission: EM | Admit: 2024-01-17 | Discharge: 2024-01-20 | DRG: 481 | Disposition: A | Discharge status: Home Health | Attending: Osteopathic Medicine | Admitting: Osteopathic Medicine

## 2024-01-17 DIAGNOSIS — I10 Essential (primary) hypertension: Secondary | ICD-10-CM | POA: Diagnosis present

## 2024-01-17 DIAGNOSIS — D72829 Elevated white blood cell count, unspecified: Secondary | ICD-10-CM | POA: Diagnosis present

## 2024-01-17 DIAGNOSIS — Z7983 Long term (current) use of bisphosphonates: Secondary | ICD-10-CM | POA: Diagnosis not present

## 2024-01-17 DIAGNOSIS — S72142A Displaced intertrochanteric fracture of left femur, initial encounter for closed fracture: Principal | ICD-10-CM | POA: Diagnosis present

## 2024-01-17 DIAGNOSIS — K219 Gastro-esophageal reflux disease without esophagitis: Secondary | ICD-10-CM | POA: Diagnosis present

## 2024-01-17 DIAGNOSIS — G629 Polyneuropathy, unspecified: Secondary | ICD-10-CM | POA: Diagnosis present

## 2024-01-17 DIAGNOSIS — E66811 Obesity, class 1: Secondary | ICD-10-CM | POA: Diagnosis present

## 2024-01-17 DIAGNOSIS — D62 Acute posthemorrhagic anemia: Secondary | ICD-10-CM | POA: Diagnosis not present

## 2024-01-17 DIAGNOSIS — F439 Reaction to severe stress, unspecified: Secondary | ICD-10-CM | POA: Diagnosis present

## 2024-01-17 DIAGNOSIS — F32A Depression, unspecified: Secondary | ICD-10-CM | POA: Diagnosis present

## 2024-01-17 DIAGNOSIS — W19XXXA Unspecified fall, initial encounter: Principal | ICD-10-CM | POA: Diagnosis present

## 2024-01-17 DIAGNOSIS — Z79899 Other long term (current) drug therapy: Secondary | ICD-10-CM | POA: Diagnosis not present

## 2024-01-17 DIAGNOSIS — N3281 Overactive bladder: Secondary | ICD-10-CM | POA: Diagnosis present

## 2024-01-17 DIAGNOSIS — E785 Hyperlipidemia, unspecified: Secondary | ICD-10-CM | POA: Diagnosis present

## 2024-01-17 DIAGNOSIS — Z90710 Acquired absence of both cervix and uterus: Secondary | ICD-10-CM | POA: Diagnosis not present

## 2024-01-17 DIAGNOSIS — S72002A Fracture of unspecified part of neck of left femur, initial encounter for closed fracture: Secondary | ICD-10-CM | POA: Diagnosis not present

## 2024-01-17 DIAGNOSIS — W010XXA Fall on same level from slipping, tripping and stumbling without subsequent striking against object, initial encounter: Secondary | ICD-10-CM | POA: Diagnosis present

## 2024-01-17 DIAGNOSIS — Z6829 Body mass index (BMI) 29.0-29.9, adult: Secondary | ICD-10-CM

## 2024-01-17 DIAGNOSIS — S72009A Fracture of unspecified part of neck of unspecified femur, initial encounter for closed fracture: Secondary | ICD-10-CM | POA: Diagnosis present

## 2024-01-17 DIAGNOSIS — Z8249 Family history of ischemic heart disease and other diseases of the circulatory system: Secondary | ICD-10-CM

## 2024-01-17 HISTORY — PX: INTRAMEDULLARY (IM) NAIL INTERTROCHANTERIC: SHX5875

## 2024-01-17 LAB — CBC WITH DIFFERENTIAL/PLATELET
Abs Immature Granulocytes: 0.14 K/uL — ABNORMAL HIGH (ref 0.00–0.07)
Basophils Absolute: 0.1 K/uL (ref 0.0–0.1)
Basophils Relative: 0 %
Eosinophils Absolute: 0.3 K/uL (ref 0.0–0.5)
Eosinophils Relative: 2 %
HCT: 38.9 % (ref 36.0–46.0)
Hemoglobin: 12.7 g/dL (ref 12.0–15.0)
Immature Granulocytes: 1 %
Lymphocytes Relative: 25 %
Lymphs Abs: 4 K/uL (ref 0.7–4.0)
MCH: 28.5 pg (ref 26.0–34.0)
MCHC: 32.6 g/dL (ref 30.0–36.0)
MCV: 87.2 fL (ref 80.0–100.0)
Monocytes Absolute: 1.4 K/uL — ABNORMAL HIGH (ref 0.1–1.0)
Monocytes Relative: 9 %
Neutro Abs: 10.1 K/uL — ABNORMAL HIGH (ref 1.7–7.7)
Neutrophils Relative %: 63 %
Platelets: 222 K/uL (ref 150–400)
RBC: 4.46 MIL/uL (ref 3.87–5.11)
RDW: 13 % (ref 11.5–15.5)
WBC: 15.9 K/uL — ABNORMAL HIGH (ref 4.0–10.5)
nRBC: 0 % (ref 0.0–0.2)

## 2024-01-17 LAB — BASIC METABOLIC PANEL WITH GFR
Anion gap: 7 (ref 5–15)
BUN: 17 mg/dL (ref 8–23)
CO2: 24 mmol/L (ref 22–32)
Calcium: 9.3 mg/dL (ref 8.9–10.3)
Chloride: 105 mmol/L (ref 98–111)
Creatinine, Ser: 0.51 mg/dL (ref 0.44–1.00)
GFR, Estimated: >60 mL/min (ref >60)
Glucose, Bld: 123 mg/dL — ABNORMAL HIGH (ref 70–99)
Potassium: 4.4 mmol/L (ref 3.5–5.1)
Sodium: 136 mmol/L (ref 135–145)

## 2024-01-17 LAB — TYPE AND SCREEN
ABO/RH(D): O POS
Antibody Screen: NEGATIVE

## 2024-01-17 SURGERY — FIXATION, FRACTURE, INTERTROCHANTERIC, WITH INTRAMEDULLARY ROD
Anesthesia: General | Site: Hip | Laterality: Left

## 2024-01-17 MED ORDER — HYDROCODONE-ACETAMINOPHEN 7.5-325 MG PO TABS: 1.0000 | ORAL_TABLET | ORAL | Status: DC | PRN

## 2024-01-17 MED ORDER — LIDOCAINE HCL (CARDIAC) PF 100 MG/5ML IV SOSY
PREFILLED_SYRINGE | INTRAVENOUS | Status: DC | PRN
  Administered 2024-01-17: 100 mg via INTRAVENOUS

## 2024-01-17 MED ORDER — MORPHINE SULFATE (PF) 2 MG/ML IV SOLN: 0.5000 mg | INTRAVENOUS | Status: DC | PRN

## 2024-01-17 MED ORDER — PROPOFOL 10 MG/ML IV BOLUS
INTRAVENOUS | Status: DC | PRN
  Administered 2024-01-17: 50 mg via INTRAVENOUS

## 2024-01-17 MED ORDER — CEFAZOLIN SODIUM-DEXTROSE 2-4 GM/100ML-% IV SOLN
2.0000 g | Freq: Four times a day (QID) | INTRAVENOUS | Status: AC
  Administered 2024-01-17 – 2024-01-18 (×2): 2 g via INTRAVENOUS
  Filled 2024-01-17 (×2): qty 100

## 2024-01-17 MED ORDER — ONDANSETRON HCL 4 MG PO TABS: 4.0000 mg | ORAL_TABLET | Freq: Four times a day (QID) | ORAL | Status: DC | PRN

## 2024-01-17 MED ORDER — OXYCODONE HCL 5 MG/5ML PO SOLN: 5.0000 mg | Freq: Once | ORAL | Status: DC | PRN

## 2024-01-17 MED ORDER — DOCUSATE SODIUM 100 MG PO CAPS
100.0000 mg | ORAL_CAPSULE | Freq: Two times a day (BID) | ORAL | Status: DC
  Administered 2024-01-17 – 2024-01-20 (×6): 100 mg via ORAL
  Filled 2024-01-17 (×6): qty 1

## 2024-01-17 MED ORDER — TRANEXAMIC ACID-NACL 1000-0.7 MG/100ML-% IV SOLN
INTRAVENOUS | Status: AC
  Filled 2024-01-17: qty 100

## 2024-01-17 MED ORDER — MORPHINE SULFATE (PF) 2 MG/ML IV SOLN
2.0000 mg | Freq: Once | INTRAVENOUS | Status: AC
  Administered 2024-01-17: 2 mg via INTRAVENOUS
  Filled 2024-01-17: qty 1

## 2024-01-17 MED ORDER — ROCURONIUM BROMIDE 100 MG/10ML IV SOLN
INTRAVENOUS | Status: DC | PRN
  Administered 2024-01-17: 40 mg via INTRAVENOUS

## 2024-01-17 MED ORDER — DIPHENHYDRAMINE HCL 12.5 MG/5ML PO ELIX: 12.5000 mg | ORAL_SOLUTION | ORAL | Status: DC | PRN

## 2024-01-17 MED ORDER — POLYETHYLENE GLYCOL 3350 17 G PO PACK: 17.0000 g | PACK | Freq: Every day | ORAL | Status: DC | PRN

## 2024-01-17 MED ORDER — MORPHINE SULFATE (PF) 2 MG/ML IV SOLN
2.0000 mg | INTRAVENOUS | Status: DC | PRN
  Administered 2024-01-17: 2 mg via INTRAVENOUS
  Filled 2024-01-17: qty 1

## 2024-01-17 MED ORDER — ENOXAPARIN SODIUM 30 MG/0.3ML IJ SOSY
30.0000 mg | PREFILLED_SYRINGE | INTRAMUSCULAR | Status: DC
  Administered 2024-01-19 – 2024-01-20 (×2): 30 mg via SUBCUTANEOUS
  Filled 2024-01-17 (×2): qty 0.3

## 2024-01-17 MED ORDER — MAGNESIUM HYDROXIDE 400 MG/5ML PO SUSP
30.0000 mL | Freq: Every day | ORAL | Status: DC | PRN
  Administered 2024-01-20: 30 mL via ORAL
  Filled 2024-01-17: qty 30

## 2024-01-17 MED ORDER — PANTOPRAZOLE SODIUM 40 MG PO TBEC
40.0000 mg | DELAYED_RELEASE_TABLET | Freq: Every day | ORAL | Status: DC
  Administered 2024-01-18 – 2024-01-20 (×3): 40 mg via ORAL
  Filled 2024-01-17 (×3): qty 1

## 2024-01-17 MED ORDER — FLUOXETINE HCL 20 MG PO CAPS
40.0000 mg | ORAL_CAPSULE | Freq: Every day | ORAL | Status: DC
  Administered 2024-01-18 – 2024-01-20 (×3): 40 mg via ORAL
  Filled 2024-01-17 (×3): qty 2

## 2024-01-17 MED ORDER — DOCUSATE SODIUM 100 MG PO CAPS: 100.0000 mg | ORAL_CAPSULE | Freq: Two times a day (BID) | ORAL | Status: DC

## 2024-01-17 MED ORDER — FENTANYL CITRATE (PF) 250 MCG/5ML IJ SOLN
INTRAMUSCULAR | Status: AC
Start: 2024-01-17 — End: 2024-01-17
  Filled 2024-01-17: qty 5

## 2024-01-17 MED ORDER — LACTATED RINGERS IV SOLN: INTRAVENOUS | Status: AC

## 2024-01-17 MED ORDER — METOCLOPRAMIDE HCL 5 MG PO TABS: 5.0000 mg | ORAL_TABLET | Freq: Three times a day (TID) | ORAL | Status: DC | PRN

## 2024-01-17 MED ORDER — LIDOCAINE HCL (PF) 2 % IJ SOLN
INTRAMUSCULAR | Status: AC
  Filled 2024-01-17: qty 5

## 2024-01-17 MED ORDER — BUPIVACAINE-EPINEPHRINE (PF) 0.5% -1:200000 IJ SOLN
INTRAMUSCULAR | Status: DC | PRN
  Administered 2024-01-17: 20 mL via PERINEURAL

## 2024-01-17 MED ORDER — FLEET ENEMA RE ENEM: 1.0000 | ENEMA | Freq: Once | RECTAL | Status: DC | PRN

## 2024-01-17 MED ORDER — BUPROPION HCL ER (XL) 150 MG PO TB24
150.0000 mg | ORAL_TABLET | Freq: Every morning | ORAL | Status: DC
  Administered 2024-01-18 – 2024-01-20 (×3): 150 mg via ORAL
  Filled 2024-01-17 (×3): qty 1

## 2024-01-17 MED ORDER — LACTATED RINGERS IV SOLN: INTRAVENOUS | Status: DC

## 2024-01-17 MED ORDER — FENTANYL CITRATE (PF) 100 MCG/2ML IJ SOLN: 25.0000 ug | INTRAMUSCULAR | Status: DC | PRN

## 2024-01-17 MED ORDER — OXYBUTYNIN CHLORIDE ER 10 MG PO TB24
10.0000 mg | ORAL_TABLET | Freq: Every day | ORAL | Status: DC
  Administered 2024-01-18 – 2024-01-20 (×3): 10 mg via ORAL
  Filled 2024-01-17 (×4): qty 1

## 2024-01-17 MED ORDER — PROPOFOL 10 MG/ML IV BOLUS
INTRAVENOUS | Status: AC
  Filled 2024-01-17: qty 20

## 2024-01-17 MED ORDER — METHOCARBAMOL 500 MG PO TABS
500.0000 mg | ORAL_TABLET | Freq: Four times a day (QID) | ORAL | Status: DC | PRN
  Administered 2024-01-18: 500 mg via ORAL
  Filled 2024-01-17: qty 1

## 2024-01-17 MED ORDER — BUPIVACAINE-EPINEPHRINE (PF) 0.5% -1:200000 IJ SOLN
INTRAMUSCULAR | Status: AC
  Filled 2024-01-17: qty 20

## 2024-01-17 MED ORDER — TRANEXAMIC ACID-NACL 1000-0.7 MG/100ML-% IV SOLN
INTRAVENOUS | Status: DC | PRN
  Administered 2024-01-17: 1000 mg via INTRAVENOUS

## 2024-01-17 MED ORDER — SUGAMMADEX SODIUM 200 MG/2ML IV SOLN
INTRAVENOUS | Status: DC | PRN
  Administered 2024-01-17: 150 mg via INTRAVENOUS

## 2024-01-17 MED ORDER — CEFAZOLIN SODIUM-DEXTROSE 2-4 GM/100ML-% IV SOLN
2.0000 g | INTRAVENOUS | Status: AC
  Administered 2024-01-17: 2 g via INTRAVENOUS

## 2024-01-17 MED ORDER — OXYCODONE HCL 5 MG PO TABS: 5.0000 mg | ORAL_TABLET | Freq: Once | ORAL | Status: DC | PRN

## 2024-01-17 MED ORDER — METHOCARBAMOL 1000 MG/10ML IJ SOLN
500.0000 mg | Freq: Four times a day (QID) | INTRAMUSCULAR | Status: DC | PRN
Start: 2024-01-17 — End: 2024-01-20

## 2024-01-17 MED ORDER — DEXAMETHASONE SODIUM PHOSPHATE 10 MG/ML IJ SOLN
INTRAMUSCULAR | Status: AC
  Filled 2024-01-17: qty 1

## 2024-01-17 MED ORDER — BISACODYL 10 MG RE SUPP: 10.0000 mg | Freq: Every day | RECTAL | Status: DC | PRN

## 2024-01-17 MED ORDER — GLYCOPYRROLATE 0.2 MG/ML IJ SOLN
INTRAMUSCULAR | Status: DC | PRN
Start: 2024-01-17 — End: 2024-01-17
  Administered 2024-01-17: .2 mg via INTRAVENOUS

## 2024-01-17 MED ORDER — BUPIVACAINE LIPOSOME 1.3 % IJ SUSP
INTRAMUSCULAR | Status: AC
  Filled 2024-01-17: qty 10

## 2024-01-17 MED ORDER — BUPIVACAINE LIPOSOME 1.3 % IJ SUSP
INTRAMUSCULAR | Status: DC | PRN
  Administered 2024-01-17: 10 mL

## 2024-01-17 MED ORDER — DEXAMETHASONE SODIUM PHOSPHATE 10 MG/ML IJ SOLN
INTRAMUSCULAR | Status: DC | PRN
  Administered 2024-01-17: 10 mg via INTRAVENOUS

## 2024-01-17 MED ORDER — EPHEDRINE SULFATE (PRESSORS) 50 MG/ML IJ SOLN
INTRAMUSCULAR | Status: DC | PRN
Start: 2024-01-17 — End: 2024-01-17
  Administered 2024-01-17: 5 mg via INTRAVENOUS

## 2024-01-17 MED ORDER — METOCLOPRAMIDE HCL 5 MG/ML IJ SOLN: 5.0000 mg | Freq: Three times a day (TID) | INTRAMUSCULAR | Status: DC | PRN

## 2024-01-17 MED ORDER — CEFAZOLIN SODIUM-DEXTROSE 2-4 GM/100ML-% IV SOLN
INTRAVENOUS | Status: AC
  Filled 2024-01-17: qty 100

## 2024-01-17 MED ORDER — ONDANSETRON HCL 4 MG/2ML IJ SOLN
4.0000 mg | Freq: Four times a day (QID) | INTRAMUSCULAR | Status: DC | PRN
  Administered 2024-01-20: 4 mg via INTRAVENOUS
  Filled 2024-01-17: qty 2

## 2024-01-17 MED ORDER — HYDROCODONE-ACETAMINOPHEN 5-325 MG PO TABS
1.0000 | ORAL_TABLET | Freq: Four times a day (QID) | ORAL | Status: DC | PRN
  Administered 2024-01-17 – 2024-01-18 (×2): 1 via ORAL
  Filled 2024-01-17 (×2): qty 1

## 2024-01-17 MED ORDER — 0.9 % SODIUM CHLORIDE (POUR BTL) OPTIME
TOPICAL | Status: DC | PRN
  Administered 2024-01-17: 500 mL

## 2024-01-17 MED ORDER — FENTANYL CITRATE (PF) 100 MCG/2ML IJ SOLN
INTRAMUSCULAR | Status: DC | PRN
  Administered 2024-01-17 (×3): 50 ug via INTRAVENOUS

## 2024-01-17 MED ORDER — PHENYLEPHRINE 80 MCG/ML (10ML) SYRINGE FOR IV PUSH (FOR BLOOD PRESSURE SUPPORT)
PREFILLED_SYRINGE | INTRAVENOUS | Status: DC | PRN
  Administered 2024-01-17 (×4): 160 ug via INTRAVENOUS

## 2024-01-17 MED ORDER — ONDANSETRON HCL 4 MG/2ML IJ SOLN
4.0000 mg | Freq: Four times a day (QID) | INTRAMUSCULAR | Status: DC | PRN
  Administered 2024-01-17: 4 mg via INTRAVENOUS

## 2024-01-17 MED ORDER — ONDANSETRON HCL 4 MG/2ML IJ SOLN
INTRAMUSCULAR | Status: AC
  Filled 2024-01-17: qty 2

## 2024-01-17 MED ORDER — ACETAMINOPHEN 325 MG PO TABS: 325.0000 mg | ORAL_TABLET | Freq: Four times a day (QID) | ORAL | Status: DC | PRN

## 2024-01-17 SURGICAL SUPPLY — 41 items
BIT DRILL 4.3MMS DISTAL GRDTED (BIT) IMPLANT
BNDG COHESIVE 4X5 TAN STRL LF (GAUZE/BANDAGES/DRESSINGS) ×1 IMPLANT
BNDG COHESIVE 6X5 TAN ST LF (GAUZE/BANDAGES/DRESSINGS) ×1 IMPLANT
CHLORAPREP W/TINT 26 (MISCELLANEOUS) ×2 IMPLANT
DRAPE C-ARMOR (DRAPES) ×1 IMPLANT
DRAPE SHEET LG 3/4 BI-LAMINATE (DRAPES) ×1 IMPLANT
DRSG MEPILEX SACRM 8.7X9.8 (GAUZE/BANDAGES/DRESSINGS) ×1 IMPLANT
DRSG OPSITE POSTOP 3X4 (GAUZE/BANDAGES/DRESSINGS) IMPLANT
DRSG OPSITE POSTOP 4X6 (GAUZE/BANDAGES/DRESSINGS) IMPLANT
ELECT CAUTERY BLADE 6.4 (BLADE) ×1 IMPLANT
ELECTRODE REM PT RTRN 9FT ADLT (ELECTROSURGICAL) ×1 IMPLANT
GAUZE SPONGE 4X4 12PLY STRL (GAUZE/BANDAGES/DRESSINGS) ×1 IMPLANT
GLOVE BIO SURGEON STRL SZ8 (GLOVE) ×2 IMPLANT
GLOVE INDICATOR 8.0 STRL GRN (GLOVE) ×1 IMPLANT
GOWN STRL REUS W/ TWL LRG LVL3 (GOWN DISPOSABLE) ×1 IMPLANT
GOWN STRL REUS W/ TWL XL LVL3 (GOWN DISPOSABLE) ×1 IMPLANT
GUIDEPIN VERSANAIL DSP 3.2X444 (ORTHOPEDIC DISPOSABLE SUPPLIES) IMPLANT
GUIDEWIRE BALL NOSE 80CM (WIRE) IMPLANT
HANDLE YANKAUER SUCT OPEN TIP (MISCELLANEOUS) ×1 IMPLANT
HFN LH 130 DEG 11MM X 380MM (Orthopedic Implant) IMPLANT
MANIFOLD NEPTUNE II (INSTRUMENTS) ×1 IMPLANT
MAT ABSORB FLUID 56X50 GRAY (MISCELLANEOUS) ×1 IMPLANT
NDL FILTER BLUNT 18X1 1/2 (NEEDLE) ×1 IMPLANT
NDL HYPO 22X1.5 SAFETY MO (MISCELLANEOUS) ×1 IMPLANT
NEEDLE FILTER BLUNT 18X1 1/2 (NEEDLE) IMPLANT
NEEDLE HYPO 22X1.5 SAFETY MO (MISCELLANEOUS) ×1 IMPLANT
NS IRRIG 500ML POUR BTL (IV SOLUTION) ×1 IMPLANT
PACK HIP COMPR (MISCELLANEOUS) ×1 IMPLANT
PENCIL SMOKE EVACUATOR (MISCELLANEOUS) ×1 IMPLANT
SCREW BONE CORTICAL 5.0X44 (Screw) IMPLANT
SCREW CANN THRD AFF 10.5X100 (Screw) IMPLANT
STAPLER SKIN PROX 35W (STAPLE) ×1 IMPLANT
STRAP SAFETY 5IN WIDE (MISCELLANEOUS) ×1 IMPLANT
SUT VIC AB 0 CT1 36 (SUTURE) ×1 IMPLANT
SUT VIC AB 1 CT1 36 (SUTURE) ×1 IMPLANT
SUT VIC AB 2-0 CT1 (SUTURE) ×2 IMPLANT
SYR 10ML LL (SYRINGE) ×1 IMPLANT
SYR 30ML LL (SYRINGE) ×1 IMPLANT
TAPE MICROFOAM 4IN (TAPE) ×1 IMPLANT
TRAP FLUID SMOKE EVACUATOR (MISCELLANEOUS) ×1 IMPLANT
WATER STERILE IRR 500ML POUR (IV SOLUTION) ×1 IMPLANT

## 2024-01-17 NOTE — Hospital Course (Addendum)
 Hospital course / significant events:   HPI: Patient with PMH of GERD, HTN, HLD, OAB, neuropathy who presented to the hospital with fall.Patient is from home.  Walks with a walker. While trying to sit down she missed a chair and fell on the left side. To ED w/ L hip pain  07/08: to ED, admitted for L hip fracture. Underwent repair w/ ortho.  07/09-07/10: PT/OT recs for SNF, pt prefers for home health and has family support, son will be coming to help. HH/DME in process, son arriving later 07/10 07/11: stable for discharge, family to transport    Consultants:  Orthopedics  Procedures/Surgeries: Repair L hip fracture     ASSESSMENT & PLAN:   Closed, displaced, intertrochanteric left hip fracture. Mechanical fall. S/p intramedullary nailing on 7/8. Pain control WBAT  PT/OT - requiring significant assistance, son to come this evening appreciate if PT/OT can demonstrate needs  Lovenox , TED hose On discharge, continue Lovenox  for 14 days  Staples can be removed on 01/31/24.   Follow-up with [facility] in 6 weeks for x-ray of the left femur.  Depression. Continue home regimen per med rec  Overactive bladder. Continue home regimen per med rec  HLD. Continue statin.  GERD. Continue PPI and famotidine   Leukocytosis. Secondary to stress reaction. Monitor CBC     Overweight/Class 1 obesity based on BMI: Body mass index is 29.35 kg/m.SABRA Significantly low or high BMI is associated with higher medical risk.  Underweight - under 18  overweight - 25 to 29 obese - 30 or more Class 1 obesity: BMI of 30.0 to 34 Class 2 obesity: BMI of 35.0 to 39 Class 3 obesity: BMI of 40.0 to 49 Super Morbid Obesity: BMI 50-59 Super-super Morbid Obesity: BMI 60+ Healthy nutrition and physical activity advised as adjunct to other disease management and risk reduction treatments    DVT prophylaxis: Lovenox , TED hose IV fluids: can d/c continuous IV fluids once taking po  Nutrition:  cardiac diet   Central lines / other devices: none  Code Status: FULL CODE ACP documentation reviewed:  none on file in [facility]  [facility] needs: SNF rehab declined, HH/DME in process  Medical barriers to dispo: none.

## 2024-01-17 NOTE — Anesthesia Postprocedure Evaluation (Signed)
 Anesthesia Post Note  Patient: Sarah Higgins  Procedure(s) Performed: FIXATION, FRACTURE, INTERTROCHANTERIC, WITH INTRAMEDULLARY ROD (Left: Hip)  Patient location during evaluation: PACU Anesthesia Type: General Level of consciousness: awake and alert Pain management: pain level controlled Vital Signs Assessment: post-procedure vital signs reviewed and stable Respiratory status: spontaneous breathing, nonlabored ventilation and respiratory function stable Cardiovascular status: blood pressure returned to baseline and stable Postop Assessment: no apparent nausea or vomiting Anesthetic complications: no   No notable events documented.   Last Vitals:  Vitals:   01/17/24 1745 01/17/24 1750  BP: 106/71   Pulse: 83 81  Resp: 16 17  Temp:    SpO2: 96% (!) 89%    Last Pain:  Vitals:   01/17/24 1745  TempSrc:   PainSc: 0-No pain                 Fairy POUR Yolunda Kloos

## 2024-01-17 NOTE — Anesthesia Preprocedure Evaluation (Signed)
 Anesthesia Evaluation  Patient identified by MRN, date of birth, ID band Patient awake    Reviewed: Allergy & Precautions, H&P , NPO status , Patient's Chart, lab work & pertinent test results, reviewed documented beta blocker date and time   History of Anesthesia Complications Negative for: history of anesthetic complications  Airway Mallampati: II  TM Distance: >3 FB Neck ROM: full    Dental no notable dental hx. (+) Edentulous Upper, Dental Advidsory Given, Missing, Poor Dentition   Pulmonary neg pulmonary ROS   Pulmonary exam normal breath sounds clear to auscultation       Cardiovascular Exercise Tolerance: Good hypertension, (-) angina (-) Past MI and (-) Cardiac Stents Normal cardiovascular exam(-) dysrhythmias + Valvular Problems/Murmurs  Rhythm:regular Rate:Normal     Neuro/Psych  PSYCHIATRIC DISORDERS  Depression    negative neurological ROS     GI/Hepatic Neg liver ROS,GERD  ,,  Endo/Other  negative endocrine ROS    Renal/GU      Musculoskeletal   Abdominal   Peds  Hematology negative hematology ROS (+)   Anesthesia Other Findings Past Medical History: No date: GERD (gastroesophageal reflux disease) No date: HLD (hyperlipidemia) No date: Hypertension   Reproductive/Obstetrics negative OB ROS                              Anesthesia Physical Anesthesia Plan  ASA: 3  Anesthesia Plan: General ETT   Post-op Pain Management:    Induction: Intravenous  PONV Risk Score and Plan: 3 and Ondansetron  and Dexamethasone   Airway Management Planned: Oral ETT  Additional Equipment:   Intra-op Plan:   Post-operative Plan: Extubation in OR  Informed Consent: I have reviewed the patients History and Physical, chart, labs and discussed the procedure including the risks, benefits and alternatives for the proposed anesthesia with the patient or authorized representative who has  indicated his/her understanding and acceptance.     Dental Advisory Given  Plan Discussed with: Anesthesiologist, CRNA and Surgeon  Anesthesia Plan Comments: (Patient consented for risks of anesthesia including but not limited to:  - adverse reactions to medications - damage to eyes, teeth, lips or other oral mucosa - nerve damage due to positioning  - sore throat or hoarseness - Damage to heart, brain, nerves, lungs, other parts of body or loss of life  Patient voiced understanding and assent.)         Anesthesia Quick Evaluation

## 2024-01-17 NOTE — ED Provider Notes (Signed)
 [facility] Provider Note    Event Date/Time   First MD Initiated Contact with Patient 01/17/24 [phone]     (approximate)   History   Fall   HPI  Sarah Higgins is a 89 y.o. female   Past medical history of hypertension hyperlipidemia presents emerged department with left hip pain after she had mechanical fall.  She was about to sit down when she missed the chair and fell onto her left side.  She did not strike her head.  She did not lose consciousness does not take blood thinners and landed on her left buttocks.  She has pain to the left hip and cannot stand due to pain.  Independent Historian contributed to assessment above: Husband corroborates information above    Physical Exam   Triage Vital Signs: ED Triage Vitals [01/17/24 0351]  Encounter Vitals Group     BP 128/72     Girls Systolic BP Percentile      Girls Diastolic BP Percentile      Boys Systolic BP Percentile      Boys Diastolic BP Percentile      Pulse Rate 65     Resp (!) 21     Temp 97.7 F (36.5 C)     Temp Source Oral     SpO2 97 %     Weight      Height      Head Circumference      Peak Flow      Pain Score      Pain Loc      Pain Education      Exclude from Growth Chart     Most recent vital signs: Vitals:   01/17/24 0351  BP: 128/72  Pulse: 65  Resp: (!) 21  Temp: 97.7 F (36.5 C)  SpO2: 97%    General: Awake, no distress.  CV:  Good peripheral perfusion.  Resp:  Normal effort.  Abd:  No distention. Other:  Shortened and externally rotated left lower extremity.  She is able to range at all other extremities.  She has tenderness to the hip but not at the knee or ankle.  Thorax abdomen back head and neck all examined and atraumatic.   ED Results / Procedures / Treatments   Labs (all labs ordered are listed, but only abnormal results are displayed) Labs Reviewed  BASIC METABOLIC PANEL WITH GFR - Abnormal; Notable for the following components:      Result  Value   Glucose, Bld 123 (*)    All other components within normal limits  CBC WITH DIFFERENTIAL/PLATELET - Abnormal; Notable for the following components:   WBC 15.9 (*)    Neutro Abs 10.1 (*)    Monocytes Absolute 1.4 (*)    Abs Immature Granulocytes 0.14 (*)    All other components within normal limits     I ordered and reviewed the above labs they are notable for cell counts electrolytes remarkable only for mild leukocytosis  EKG  ED ECG REPORT I, Ginnie Shams, the attending physician, personally viewed and interpreted this ECG.   Date: 01/17/2024  EKG Time: 0355  Rate: 63  Rhythm: sinus  Axis: nl  Intervals:short pr  ST&T Change: no stemi    RADIOLOGY I independently reviewed and interpreted x-ray of the hip and see a fracture I also reviewed radiologist's formal read.   PROCEDURES:  Critical Care performed: No  Procedures   MEDICATIONS ORDERED IN ED: Medications  morphine  (PF)  2 MG/ML injection 2 mg (2 mg Intravenous Given 01/17/24 0555)    External physician / consultants:  I spoke with orthopedics regarding care plan for this patient.   IMPRESSION / MDM / ASSESSMENT AND PLAN / ED COURSE  I reviewed the triage vital signs and the nursing notes.                                Patient's presentation is most consistent with acute presentation with potential threat to life or bodily function.  Differential diagnosis includes, but is not limited to, hip fracture dislocation   The patient is on the cardiac monitor to evaluate for evidence of arrhythmia and/or significant heart rate changes.  MDM:    Left hip fracture after fall onto buttocks mechanical fall, patient pain well-controlled and neurovascular intact.  Consulted orthopedics plan for OR in the afternoon.  N.p.o. now.  Admit to hospital service        FINAL CLINICAL IMPRESSION(S) / ED DIAGNOSES   Final diagnoses:  Fall, initial encounter  Closed fracture of left hip, initial encounter  [facility])     Rx / DC Orders   ED Discharge Orders     None        Note:  This document was prepared using Dragon voice recognition software and may include unintentional dictation errors.    Cyrena Mylar, MD 01/17/24 [phone]

## 2024-01-17 NOTE — Op Note (Signed)
 01/17/2024  5:23 PM  Patient:   Sarah Higgins  Pre-Op Diagnosis:   Closed displaced intertrochanteric fracture, left hip.  Post-Op Diagnosis:   Same  Procedure:   Reduction and internal fixation of displaced intertrochanteric left hip fracture with Biomet Affixis TFN nail.  Surgeon:   DOROTHA Reyes Maltos, MD  Assistant:   None  Anesthesia:   GET  Findings:   As above  Complications:   None  EBL:   75 cc  Fluids:   700 cc crystalloid  UOP:   None  TT:   None  Drains:   None  Closure:   Staples  Implants:   Biomet Affixis 11 x 380 mm TFN with a 100 mm lag screw and a 44 mm distal interlocking screw  Brief Clinical Note:   The patient is an 89 year old female who sustained the above-noted injury last night when she apparently lost her balance and fell while trying to sit down in her chair. She was brought to the emergency room where x-rays demonstrated the above-noted injury. The patient has been cleared medically and presents at this time for reduction and internal fixation of the displaced intertrochanteric left hip fracture.  Procedure:   The patient was brought into the operating room and lain in the supine position. After adequate general endotracheal intubation and anesthesia were obtained, the patient was repositioned on the fracture table. The uninjured leg was placed in a flexed and abducted position while the injured lower extremity was placed in longitudinal traction. The fracture was reduced using longitudinal traction and internal rotation. The adequacy of reduction was verified fluoroscopically in AP and lateral projections and found to be near anatomic. The lateral aspects of the left hip and thigh were prepped with ChloraPrep solution before being draped sterilely. Preoperative antibiotics were administered. A timeout was performed to verify the appropriate surgical site.   The greater trochanter was identified fluoroscopically and an approximately 6-7 cm incision made  about 2-3 fingerbreadths above the tip of the greater trochanter. The incision was carried down through the subcutaneous tissues to expose the gluteal fascia. This was split the length of the incision, providing access to the tip of the trochanter. Under fluoroscopic guidance, a guidewire was drilled through the tip of the trochanter into the proximal metaphysis to the level of the lesser trochanter. After verifying its position fluoroscopically in AP and lateral projections, it was overreamed with the initial reamer to the depth of the lesser trochanter. A guidewire was passed down through the femoral canal to the supracondylar region. The adequacy of guidewire position was verified fluoroscopically in AP and lateral projections before the length of the guidewire within the canal was measured and found to be 410 mm. Therefore, a 380 mm length nail was selected. The guidewire was overreamed sequentially using the flexible reamers, beginning with a 9.5 mm reamer and progressing to a 12.5 mm reamer. This provided good cortical chatter. The 11 x 380 mm Biomet Affixis TFN rod was selected and advanced to the appropriate depth, as verified fluoroscopically.   The guide system for the lag screw was positioned and advanced through an approximately 2 cm stab incision over the lateral aspect of the proximal femur. The guidewire was drilled up through the trochanteric femoral nail and into the femoral neck to rest within 5 mm of subchondral bone. After verifying its position in the femoral neck and head in both AP and lateral projections, the guidewire was measured and found to be optimally replicated by  a 100 mm lag screw. The guidewire was overreamed to the appropriate depth before the lag screw was inserted and advanced to the appropriate depth as verified fluoroscopically in AP and lateral projections. The locking screw was advanced, then backed off a quarter turn to set the lag screw. Again the adequacy of hardware  position and fracture reduction was verified fluoroscopically in AP and lateral projections and found to be excellent.  Attention was directed distally. Using the perfect circle technique, the leg and fluoroscopy machine were positioned appropriately. An approximately 1.5 cm stab incision was made over the skin at the appropriate point before the drill bit was advanced through the cortex and across the static hole of the nail. The appropriate length of the screw was determined before the 44 mm distal interlocking screw was positioned, then advanced and tightened securely. Again the adequacy of screw position was verified fluoroscopically in AP and lateral projections and found to be excellent.  The wounds were irrigated thoroughly with sterile saline solution before the abductor fascia was reapproximated using #1 Vicryl interrupted sutures. The subcutaneous tissues were closed using 2-0 Vicryl interrupted sutures. The skin was closed using staples. A solution of 10 cc of Exparel  plus 20 cc of 0.5% Sensorcaine  with epinephrine  was injected in and around all incisions. Sterile occlusive dressings were applied to all wounds before the patient was awakened, extubated, and returned to the recovery room in satisfactory condition after tolerating the procedure well.

## 2024-01-17 NOTE — Transfer of Care (Signed)
 Immediate Anesthesia Transfer of Care Note  Patient: Sarah Higgins  Procedure(s) Performed: FIXATION, FRACTURE, INTERTROCHANTERIC, WITH INTRAMEDULLARY ROD (Left: Hip)  Patient Location: PACU  Anesthesia Type:General  Level of Consciousness: drowsy and patient cooperative  Airway & Oxygen Therapy: Patient Spontanous Breathing and Patient connected to face mask oxygen  Post-op Assessment: Report given to RN and Post -op Vital signs reviewed and stable  Post vital signs: Reviewed and stable  Last Vitals:  Vitals Value Taken Time  BP 105/58 01/17/24 17:25  Temp    Pulse 72 01/17/24 17:26  Resp 13 01/17/24 17:26  SpO2 99 % 01/17/24 17:26  Vitals shown include unfiled device data.  Last Pain:  Vitals:   01/17/24 1413  TempSrc: Temporal  PainSc: 10-Worst pain ever      Patients Stated Pain Goal: 2 (01/17/24 1413)  Complications: No notable events documented.

## 2024-01-17 NOTE — ED Triage Notes (Signed)
 BIB ems from home for fall with left leg pain and hip deformity  No loc, no thinners, did not hit head  of fentanyl  given pta  Cgb 117  Pt a&o x4, left foot rotated out toward left side

## 2024-01-17 NOTE — TOC CM/SW Note (Deleted)
..  Transition of Care [facility]) - Inpatient Brief Assessment   Patient Details  Name: Sarah Higgins MRN: [id number] Date of Birth: [birth year]  Transition of [facility]) CM/SW Contact:    Edsel DELENA Fischer, LCSW Phone Number: 01/17/2024, 3:10 PM   Clinical Narrative:  SW went by pt room in ED to complete assessment. Pt was not present.  SW will follow up with pt to conduct TOC assessment.   Transition of Care Asessment:

## 2024-01-17 NOTE — Consult Note (Signed)
 ORTHOPAEDIC CONSULTATION  REQUESTING PHYSICIAN: Tobie Yetta HERO, MD  Chief Complaint:   Left hip pain.  History of Present Illness: Sarah Higgins is a 89 y.o. female with a history of hypertension, hyperlipidemia, gastroesophageal reflux disease who normally lives independently.  The patient was in her usual state of health last evening when she apparently missed the chair while attempting to sit down and landed on her left hip.  She was brought to the emergency room where x-rays demonstrated a displaced left intertrochanteric hip fracture.  Subsequently, the patient has been admitted for definitive management of this injury.  The patient denies any associated injuries.  She did not strike her head or lose consciousness.  The patient also denies any lightheadedness, dizziness, chest pain, shortness of breath, or other symptoms which may have precipitated her fall.  Past Medical History:  Diagnosis Date   GERD (gastroesophageal reflux disease)    HLD (hyperlipidemia)    Hypertension    Past Surgical History:  Procedure Laterality Date   ABDOMINAL HYSTERECTOMY     ORIF ANKLE FRACTURE Left 11/30/2020   Procedure: OPEN REDUCTION INTERNAL FIXATION (ORIF) ANKLE FRACTURE;  Surgeon: Marchia Drivers, MD;  Location: [facility] ORS;  Service: Orthopedics;  Laterality: Left;   Social History   Socioeconomic History   Marital status: Married    Spouse name: Not on file   Number of children: Not on file   Years of education: Not on file   Highest education level: Not on file  Occupational History   Not on file  Tobacco Use   Smoking status: Never   Smokeless tobacco: Never  Vaping Use   Vaping status: Never Used  Substance and Sexual Activity   Alcohol use: Not Currently   Drug use: Not Currently   Sexual activity: Not on file  Other Topics Concern   Not on file  Social History Narrative   Not on file   Social Drivers of Health    Financial Resource Strain: Low Risk  (10/23/2018)   Received from [facility]   Overall Financial Resource Strain (CARDIA)    Difficulty of Paying Living Expenses: Not very hard  Food Insecurity: No Food Insecurity (06/15/2020)   Received from [facility]   Hunger Vital Sign    Within the past 12 months, you worried that your food would run out before you got the money to buy more.: Never true    Within the past 12 months, the food you bought just didn't last and you didn't have money to get more.: Never true  Transportation Needs: No Transportation Needs (10/23/2018)   Received from [facility]   PRAPARE - Transportation    Lack of Transportation (Medical): No    Lack of Transportation (Non-Medical): No  Physical Activity: Inactive (10/23/2018)   Received from [facility]   Exercise Vital Sign    On average, how many days per week do you engage in moderate to strenuous exercise (like a brisk walk)?: 0 days    On average, how many minutes do you engage in exercise at this level?: 0 min  Stress: No Stress Concern Present (10/23/2018)   Received from [facility] - Occupational Stress Questionnaire    Feeling of Stress : Not at all  Social Connections: Moderately Integrated (10/23/2018)   Received from [facility]   Social Connection and Isolation Panel    In a typical week, how many times do you  talk on the phone with family, friends, or neighbors?: More than three times a week    How often do you get together with friends or relatives?: More than three times a week    How often do you attend church or religious services?: More than 4 times per year    Do you belong to any clubs or organizations such as church groups, unions, fraternal or athletic groups, or school groups?: No    How often do you attend meetings of the clubs or organizations you belong to?: Never    Are you married, widowed, divorced, separated, never  married, or living with a partner?: Married   Family History  Problem Relation Age of Onset   Hypertension Mother    Hypertension Father    No Known Allergies Prior to Admission medications   Medication Sig Start Date End Date Taking? Authorizing Provider  acetaminophen  (TYLENOL ) 325 MG tablet Take 2 tablets (650 mg total) by mouth every 6 (six) hours as needed for fever, headache or moderate pain. 12/03/20   Jens Durand, MD  albuterol  (VENTOLIN  HFA) 108 (90 Base) MCG/ACT inhaler Inhale 2 puffs into the lungs 2 (two) times daily. 11/18/23     alendronate  (FOSAMAX ) 70 MG tablet Take 1 tablet (70 mg total) by mouth every 7 (seven) days. 11/18/23     ascorbic acid  (VITAMIN C) 500 MG tablet Take 1 tablet (500 mg total) by mouth daily. 11/18/23     benzonatate  (TESSALON ) 100 MG capsule Take 100 mg by mouth 3 (three) times daily as needed for cough.    [provider]  benzonatate  (TESSALON ) 100 MG capsule Take 1 capsule (100 mg total) by mouth 3 (three) times daily as needed for cough. 11/18/23     buPROPion  (WELLBUTRIN  XL) 150 MG 24 hr tablet Take 1 tablet (150 mg total) by mouth every morning. 11/18/23     esomeprazole  (NEXIUM ) 40 MG capsule Take 1 capsule (40 mg total) by mouth daily before dinner. 11/18/23     famotidine  (PEPCID ) 40 MG tablet Take 1 tablet (40 mg total) by mouth nightly as needed for heartburn. 11/18/23     FLUoxetine  (PROZAC ) 40 MG capsule Take 1 capsule (40 mg total) by mouth daily. 11/18/23     fluticasone  (FLONASE ) 50 MCG/ACT nasal spray Place 1 spray into both nostrils daily.    [provider]  fluticasone  (FLONASE ) 50 MCG/ACT nasal spray Place 1 spray into both nostrils daily. 11/18/23     gabapentin  (NEURONTIN ) 100 MG capsule Take 200 mg by mouth 3 (three) times daily.    [provider]  gabapentin  (NEURONTIN ) 100 MG capsule Take 1 capsule (100 mg total) by mouth 2 (two) times daily. 11/18/23     gabapentin  (NEURONTIN ) 300 MG capsule Take 1 capsule (300 mg  total) by mouth at bedtime. 11/18/23     latanoprost  (XALATAN ) 0.005 % ophthalmic solution Place 1 drop into both eyes at bedtime.    [provider]  levocetirizine (XYZAL ) 5 MG tablet Take 5 mg by mouth daily.    [provider]  levocetirizine (XYZAL ) 5 MG tablet Take 1 tablet (5 mg total) by mouth at bedtime. 11/18/23     loperamide  (IMODIUM ) 2 MG capsule Take 4 mg by mouth 2 (two) times daily as needed for diarrhea or loose stools.    [provider]  lovastatin  (MEVACOR ) 20 MG tablet Take 20 mg by mouth every evening.    [provider]  lovastatin  (MEVACOR ) 20 MG  tablet Take 1 tablet (20 mg total) by mouth daily with evening meal. 11/18/23     omeprazole (PRILOSEC) 40 MG capsule Take 40 mg by mouth daily.    [provider]  oxybutynin  (DITROPAN -XL) 10 MG 24 hr tablet Take 10 mg by mouth daily.    [provider]  oxybutynin  (DITROPAN -XL) 10 MG 24 hr tablet Take 1 tablet (10 mg total) by mouth daily. 11/18/23     polyethylene glycol (MIRALAX  / GLYCOLAX ) 17 g packet Take 17 g by mouth daily as needed for mild constipation. 12/03/20   Jens Durand, MD  senna-docusate (SENOKOT-S) 8.6-50 MG tablet Take 1 tablet by mouth at bedtime as needed for mild constipation. 12/03/20   Jens Durand, MD  sodium chloride  HYPERTONIC 3 % nebulizer solution Take 4 mLs by nebulization 2 (two) times daily.    [provider]  venlafaxine  XR (EFFEXOR -XR) 150 MG 24 hr capsule Take 150 mg by mouth daily.    [provider]   DG Chest 1 View Result Date: 01/17/2024 CLINICAL DATA:  Hip fracture.  Preoperative respiratory evaluation. EXAM: CHEST  1 VIEW COMPARISON:  11/30/2020 FINDINGS: Chronic atelectasis or scarring noted in the parahilar right lung, similar to prior. Ill-defined airspace opacity in the right upper lung is also similar. Left lung remains clear. Cardiopericardial silhouette is at upper limits of normal for size. Small hiatal hernia evident.  Bones are diffusely demineralized. Telemetry leads overlie the chest. IMPRESSION: 1. Chronic atelectasis or scarring in the parahilar right lung with ill-defined airspace opacity in the right upper lung. No significant change from prior chest x-ray. Remote chest CT from 20/12 showed prominent tree-in-bud nodularity with airway impaction in the right upper and lower lobes compatible with bronchopneumonia/atypical infection. 2. Small hiatal hernia. Electronically Signed   By: Camellia Candle M.D.   On: 01/17/2024 06:12   DG Hip Unilat W or Wo Pelvis 2-3 Views Left Result Date: 01/17/2024 CLINICAL DATA:  Fall.  Severe left hip pain. EXAM: DG HIP (WITH OR WITHOUT PELVIS) 2-3V LEFT COMPARISON:  12/01/2020 FINDINGS: Bones are diffusely demineralized. SI joints and symphysis pubis unremarkable. No evidence for pubic ramus fracture. AP and frog-leg lateral views of the left hip show a intertrochanteric comminuted left proximal femur fracture with varus angulation. IMPRESSION: Intertrochanteric comminuted left proximal femur fracture with varus angulation. Electronically Signed   By: Camellia Candle M.D.   On: 01/17/2024 06:10    Positive ROS: All other systems have been reviewed and were otherwise negative with the exception of those mentioned in the HPI and as above.  Physical Exam: General:  Alert, no acute distress Psychiatric:  Patient is competent for consent with normal mood and affect   Cardiovascular:  No pedal edema Respiratory:  No wheezing, non-labored breathing GI:  Abdomen is soft and non-tender Skin:  No lesions in the area of chief complaint Neurologic:  Sensation intact distally Lymphatic:  No axillary or cervical lymphadenopathy  Orthopedic Exam:  Orthopedic examination is limited to the left hip and lower extremity.  The left lower extremity is somewhat shortened and externally rotated as compared to the right.  Skin inspection around the left hip is notable for mild swelling, but otherwise  is unremarkable.  No erythema, ecchymosis, abrasions, or other skin abnormalities are identified.  She has mild-moderate tenderness to palpation over the lateral aspect of the left hip.  She has more severe pain with any attempted active or passive motion of the hip.  She is grossly neurovascularly intact  to the left lower extremity and foot.  X-rays:  X-rays of the pelvis and left hip are available for review and have been reviewed by myself.  The findings are as described above.  There is a varus angulated comminuted intertrochanteric fracture of the left hip.  The hip joint itself appears to be well-maintained and without evidence for significant degenerative changes.  Diffuse osteopenia is noted throughout the visualized bony structures.  Assessment: Displaced intertrochanteric fracture, left hip.  Plan: The treatment options, including both surgical and nonsurgical choices, have been discussed in detail with the patient and her family.  The patient would like to proceed with surgical intervention to include an intramedullary nailing of the displaced intertrochanteric fracture of the left hip.  The risks (including bleeding, infection, nerve and/or blood vessel injury, persistent or recurrent pain, loosening or failure of the components, leg length inequality, malunion and/or nonunion, need for further surgery, blood clots, strokes, heart attacks or arrhythmias, pneumonia, etc.) and benefits of the surgical procedure were discussed.  The patient states her understanding and agrees to proceed.  A formal written consent will be obtained by the nursing staff.  Thank you for asking me to participate in the care of this most pleasant yet unfortunate woman.  I will be happy to follow her with you.   Sarah Reyes Maltos, MD  Beeper #:  [phone]  01/17/2024 7:27 AM

## 2024-01-17 NOTE — H&P (Signed)
 History and Physical  Patient: Sarah Higgins FMW:[id number] DOB: 1935/07/06 DOA: 01/17/2024 DOS: the patient was seen and examined on 01/17/2024 Patient coming from: Home  Chief Complaint: Fall  HPI: Patient with PMH of GERD, HTN, HLD, OAB, neuropathy who presented to the hospital with fall. Patient is from home.  Walks with a walker. She was walking to her chair.  While trying to sit down she missed a chair and fell on the left side. Denies having any head injury or neck injury. Denies any passing out but episode. She denies any change in her medications lately. After the fall sustained pain in her left hip. At the time of my evaluation denies any complaints of, fever, headache, neck pain, chest pain, abdominal pain, change in her medication, diarrhea, constipation. She had history of left ankle fracture in the past and reports she did well with the surgery. Husband at the bedside reports that she was seen by her cardiologist recently and was told that she is doing fine with regards to her heart.  Assessment and Plan: Closed, displaced, intertrochanteric left hip fracture. Presents with a mechanical fall. Orthopedic surgery were consulted for findings of comminuted intertrochanteric fracture of the left hip. Patient currently scheduled for intramedullary nailing on 7/8. Weightbearing status, PT OT status, pain medication, DVT prophylaxis per orthopedics more than temporarily I have ordered Lovenox  with a start date of 7/10.  Preoperative medical evaluation Charlanne dunker al. Estimated Risk Probability for Perioperative Myocardial Infarction or Cardiac Arrest: 0.2 % EKG: normal sinus rhythm, nonspecific ST and T waves changes.  Chest X ray respiratory atelectasis and hiatal hernia Echocardiogram 8/24 EF more than 50%, aortic sclerosis with mild AR. Recommend No further workup  Long discussion with husband at bedside, they understands patient will be at low risk for the surgical procedure.  Alternative surgical intervention were discussed by the surgery team. Family accepts all risks and wants to proceed with surgical intervention.   Depression. Continue home regimen including Wellbutrin  and Prozac .  Overactive bladder. Continue Ditropan . May have difficulty with Foley removal after the surgery.  HLD. Continue statin.  GERD. Continue PPI.  Leukocytosis. Secondary to stress reaction. Monitor.  Advance Care Planning:   Code Status: Full Code  Consults: Orthopedics  Prior to Admission medications   Medication Sig Start Date End Date Taking? Authorizing Provider  acetaminophen  (TYLENOL ) 325 MG tablet Take 2 tablets (650 mg total) by mouth every 6 (six) hours as needed for fever, headache or moderate pain. 12/03/20   Jens Durand, MD  albuterol  (VENTOLIN  HFA) 108 (90 Base) MCG/ACT inhaler Inhale 2 puffs into the lungs 2 (two) times daily. 11/18/23     alendronate  (FOSAMAX ) 70 MG tablet Take 1 tablet (70 mg total) by mouth every 7 (seven) days. 11/18/23     ascorbic acid  (VITAMIN C) 500 MG tablet Take 1 tablet (500 mg total) by mouth daily. 11/18/23     benzonatate  (TESSALON ) 100 MG capsule Take 100 mg by mouth 3 (three) times daily as needed for cough.    [provider]  benzonatate  (TESSALON ) 100 MG capsule Take 1 capsule (100 mg total) by mouth 3 (three) times daily as needed for cough. 11/18/23     buPROPion  (WELLBUTRIN  XL) 150 MG 24 hr tablet Take 1 tablet (150 mg total) by mouth every morning. 11/18/23     esomeprazole  (NEXIUM ) 40 MG capsule Take 1 capsule (40 mg total) by mouth daily before dinner. 11/18/23     famotidine  (PEPCID ) 40 MG tablet Take  1 tablet (40 mg total) by mouth nightly as needed for heartburn. 11/18/23     FLUoxetine  (PROZAC ) 40 MG capsule Take 1 capsule (40 mg total) by mouth daily. 11/18/23     fluticasone  (FLONASE ) 50 MCG/ACT nasal spray Place 1 spray into both nostrils daily.    [provider]  fluticasone  (FLONASE ) 50 MCG/ACT nasal spray  Place 1 spray into both nostrils daily. 11/18/23     gabapentin  (NEURONTIN ) 100 MG capsule Take 200 mg by mouth 3 (three) times daily.    [provider]  gabapentin  (NEURONTIN ) 100 MG capsule Take 1 capsule (100 mg total) by mouth 2 (two) times daily. 11/18/23     gabapentin  (NEURONTIN ) 300 MG capsule Take 1 capsule (300 mg total) by mouth at bedtime. 11/18/23     latanoprost  (XALATAN ) 0.005 % ophthalmic solution Place 1 drop into both eyes at bedtime.    [provider]  levocetirizine (XYZAL ) 5 MG tablet Take 5 mg by mouth daily.    [provider]  levocetirizine (XYZAL ) 5 MG tablet Take 1 tablet (5 mg total) by mouth at bedtime. 11/18/23     loperamide  (IMODIUM ) 2 MG capsule Take 4 mg by mouth 2 (two) times daily as needed for diarrhea or loose stools.    [provider]  lovastatin  (MEVACOR ) 20 MG tablet Take 20 mg by mouth every evening.    [provider]  lovastatin  (MEVACOR ) 20 MG tablet Take 1 tablet (20 mg total) by mouth daily with evening meal. 11/18/23     omeprazole (PRILOSEC) 40 MG capsule Take 40 mg by mouth daily.    [provider]  oxybutynin  (DITROPAN -XL) 10 MG 24 hr tablet Take 10 mg by mouth daily.    [provider]  oxybutynin  (DITROPAN -XL) 10 MG 24 hr tablet Take 1 tablet (10 mg total) by mouth daily. 11/18/23     polyethylene glycol (MIRALAX  / GLYCOLAX ) 17 g packet Take 17 g by mouth daily as needed for mild constipation. 12/03/20   Jens Durand, MD  senna-docusate (SENOKOT-S) 8.6-50 MG tablet Take 1 tablet by mouth at bedtime as needed for mild constipation. 12/03/20   Jens Durand, MD  sodium chloride  HYPERTONIC 3 % nebulizer solution Take 4 mLs by nebulization 2 (two) times daily.    [provider]  venlafaxine  XR (EFFEXOR -XR) 150 MG 24 hr capsule Take 150 mg by mouth daily.    [provider]    Past Medical History:  Diagnosis Date   GERD (gastroesophageal reflux disease)    HLD  (hyperlipidemia)    Hypertension    Past Surgical History:  Procedure Laterality Date   ABDOMINAL HYSTERECTOMY     ORIF ANKLE FRACTURE Left 11/30/2020   Procedure: OPEN REDUCTION INTERNAL FIXATION (ORIF) ANKLE FRACTURE;  Surgeon: Marchia Drivers, MD;  Location: [facility] ORS;  Service: Orthopedics;  Laterality: Left;   Social History:  reports that she has never smoked. She has never used smokeless tobacco. She reports that she does not currently use alcohol. She reports that she does not currently use drugs. No Known Allergies Family History  Problem Relation Age of Onset   Hypertension Mother    Hypertension Father    Physical Exam: Vitals:   01/17/24 1030 01/17/24 1038 01/17/24 1039 01/17/24 1040  BP:  123/66    Pulse: (!) 56 (!) 56 (!) 57 (!) 57  Resp: 17 16 (!) 21 (!) 22  Temp:      TempSrc:      SpO2:  96% 97% 96% 96%   General: Appear in mild distress; no visible Abnormal Neck Mass Or lumps, Conjunctiva normal Cardiovascular: S1 and S2 Present, aortic systolic  Murmur, Respiratory: good respiratory effort, Bilateral Air entry present and CTA, no Crackles, no wheezes Abdomen: Bowel Sound present, Non tender  Extremities: no Pedal edema Neurology: alert and oriented to time, place, and person Gait not checked due to patient safety concerns   Data Reviewed: I have reviewed ED notes, Vitals, Lab results and outpatient records. Since last encounter, pertinent lab results CBC and BMP   . I have ordered test including CBC and BMP  .   Family Communication: Husband at bedside  Author: Yetta Blanch, MD 01/17/2024 11:44 AM For on call review [email or web address].

## 2024-01-17 NOTE — Anesthesia Procedure Notes (Signed)
 Procedure Name: Intubation Date/Time: 01/17/2024 3:55 PM  Performed by: Norleen Alberta HERO., CRNAPre-anesthesia Checklist: Patient identified, Patient being monitored, Timeout performed, Emergency Drugs available and Suction available Patient Re-evaluated:Patient Re-evaluated prior to induction Oxygen Delivery Method: Circle system utilized Preoxygenation: Pre-oxygenation with 100% oxygen Induction Type: IV induction Ventilation: Mask ventilation without difficulty Laryngoscope Size: 3 and McGrath Grade View: Grade I Tube type: Oral Tube size: 7.0 mm Number of attempts: 1 Airway Equipment and Method: Stylet Placement Confirmation: ETT inserted through vocal cords under direct vision, positive ETCO2 and breath sounds checked- equal and bilateral Secured at: 21 cm Tube secured with: Tape Dental Injury: Teeth and Oropharynx as per pre-operative assessment

## 2024-01-18 ENCOUNTER — Encounter: Payer: Self-pay | Admitting: Surgery

## 2024-01-18 DIAGNOSIS — S72142A Displaced intertrochanteric fracture of left femur, initial encounter for closed fracture: Secondary | ICD-10-CM | POA: Diagnosis not present

## 2024-01-18 LAB — BASIC METABOLIC PANEL WITH GFR
Anion gap: 7 (ref 5–15)
BUN: 14 mg/dL (ref 8–23)
CO2: 25 mmol/L (ref 22–32)
Calcium: 8.8 mg/dL — ABNORMAL LOW (ref 8.9–10.3)
Chloride: 103 mmol/L (ref 98–111)
Creatinine, Ser: 0.52 mg/dL (ref 0.44–1.00)
GFR, Estimated: >60 mL/min (ref >60)
Glucose, Bld: 142 mg/dL — ABNORMAL HIGH (ref 70–99)
Potassium: 4.6 mmol/L (ref 3.5–5.1)
Sodium: 135 mmol/L (ref 135–145)

## 2024-01-18 LAB — VITAMIN D 25 HYDROXY (VIT D DEFICIENCY, FRACTURES): Vit D, 25-Hydroxy: 36.93 ng/mL (ref 30–100)

## 2024-01-18 LAB — CBC
HCT: 33 % — ABNORMAL LOW (ref 36.0–46.0)
Hemoglobin: 10.6 g/dL — ABNORMAL LOW (ref 12.0–15.0)
MCH: 28 pg (ref 26.0–34.0)
MCHC: 32.1 g/dL (ref 30.0–36.0)
MCV: 87.1 fL (ref 80.0–100.0)
Platelets: 176 K/uL (ref 150–400)
RBC: 3.79 MIL/uL — ABNORMAL LOW (ref 3.87–5.11)
RDW: 12.9 % (ref 11.5–15.5)
WBC: 15.9 K/uL — ABNORMAL HIGH (ref 4.0–10.5)
nRBC: 0 % (ref 0.0–0.2)

## 2024-01-18 MED ORDER — ADULT MULTIVITAMIN W/MINERALS CH
1.0000 | ORAL_TABLET | Freq: Every day | ORAL | Status: DC
  Administered 2024-01-18 – 2024-01-20 (×3): 1 via ORAL
  Filled 2024-01-18 (×3): qty 1

## 2024-01-18 MED ORDER — HYDROCODONE-ACETAMINOPHEN 5-325 MG PO TABS
1.0000 | ORAL_TABLET | ORAL | Status: DC | PRN
  Administered 2024-01-18 (×2): 2 via ORAL
  Administered 2024-01-18: 1 via ORAL
  Administered 2024-01-19 – 2024-01-20 (×4): 2 via ORAL
  Administered 2024-01-20: 1 via ORAL
  Filled 2024-01-18: qty 1
  Filled 2024-01-18 (×6): qty 2
  Filled 2024-01-18: qty 1
  Filled 2024-01-18: qty 2

## 2024-01-18 MED ORDER — ENSURE PLUS HIGH PROTEIN PO LIQD: 237.0000 mL | Freq: Two times a day (BID) | ORAL | Status: DC

## 2024-01-18 MED ORDER — ADULT MULTIVITAMIN W/MINERALS CH: 1.0000 | ORAL_TABLET | Freq: Every day | ORAL | Status: DC

## 2024-01-18 NOTE — Progress Notes (Signed)
 Initial Nutrition Assessment  DOCUMENTATION CODES:   Not applicable  INTERVENTION:   -Liberalize diet to regular for widest variety of meal selections -Obtain new wt -MVI with minerals daily -Ensure Plus High Protein po BID, each supplement provides 350 kcal and 20 grams of protein   NUTRITION DIAGNOSIS:   Increased nutrient needs related to post-op healing as evidenced by estimated needs.  GOAL:   Patient will meet greater than or equal to 90% of their needs  MONITOR:   PO intake, Supplement acceptance  REASON FOR ASSESSMENT:   Consult Assessment of nutrition requirement/status  ASSESSMENT:   Pt PMH of GERD, HTN, HLD, OAB, neuropathy who presented to the hospital with fall.  Pt admitted with lt hip fracture s/p fall.   7/8- s/p Reduction and internal fixation of displaced intertrochanteric left hip fracture with [employer] TFN nail.   Reviewed I/O's: +1.6 L x 24 hours   Pt working with physical therapy at time of visit.   Pt currently on a heart healthy diet. No meal completion data available to assess at this time.   Reviewed wt hx; last wt recorded on 10/04/23 was 85 kg. RD will order new wt to better assess weight trends.   Pt increased nutritional needs for post-op healing and would benefit from addition of oral nutrition supplements.   Medications reviewed and include colace and protonix .   Labs reviewed: CBGS: 109 (inpatient orders for glycemic control are none).    NUTRITION - FOCUSED PHYSICAL EXAM:  Flowsheet Row Most Recent Value  Orbital Region No depletion  Upper Arm Region No depletion  Thoracic and Lumbar Region No depletion  Buccal Region No depletion  Temple Region No depletion  Clavicle Bone Region No depletion  Clavicle and Acromion Bone Region No depletion  Scapular Bone Region No depletion  Dorsal Hand No depletion  Patellar Region No depletion  Anterior Thigh Region No depletion  Posterior Calf Region No depletion  Edema (RD  Assessment) None  Hair Reviewed  Eyes Reviewed  Mouth Reviewed  Skin Reviewed  Nails Reviewed    Diet Order:   Diet Order             Diet regular Fluid consistency: Thin  Diet effective now                   EDUCATION NEEDS:   No education needs have been identified at this time  Skin:  Skin Assessment: Skin Integrity Issues: Skin Integrity Issues:: Incisions Incisions: closed lt hip, lt thigh  Last BM:  01/16/24  Height:   Ht Readings from Last 1 Encounters:  01/17/24 5' 7 (1.702 m)    Weight:   Wt Readings from Last 1 Encounters:  10/04/23 85 kg    Ideal Body Weight:  61.4 kg  BMI:  Body mass index is 29.35 kg/m.  Estimated Nutritional Needs:   Kcal:  1550-1750  Protein:  75-90 grams  Fluid:  1.5-1.7 L    Margery ORN, RD, LDN, CDCES Registered Dietitian III Certified Diabetes Care and Education Specialist If unable to reach this RD, please use RD Inpatient group chat on secure chat between hours of 8am-4 pm daily

## 2024-01-18 NOTE — Progress Notes (Signed)
 Physical Therapy Treatment Patient Details Name: Sarah Higgins MRN: [id number] DOB: 01/17/1935 Today's Date: 01/18/2024   History of Present Illness Pt is an 89 year old female s/p fall resulting in closed, displaced intertrochanteric L hip fx s/p intramedullary nailing on 7/8.  PMH of GERD, HTN, HLD, OAB, neuropathy, depression    PT Comments  Pt continues to be pleasant and motivated but still very hesitant with any L hip movement (active or passive) and very guarded with attempts to put much weight through L LE this afternoon.  After multiple failed maxA attempts to stand we did a maxA squat pivot back to bed.  Pt reports 5/10 pain at rest that did increase with any L hip movement.  She did tolerate some increased hip flexion range (per AM session) with heel slides but remains functionally limited.  Pt will need ongoing PT, continue with POC.     If plan is discharge home, recommend the following: Two people to help with walking and/or transfers;A lot of help with bathing/dressing/bathroom;Assistance with cooking/housework;Help with stairs or ramp for entrance;Assist for transportation   Can travel by private vehicle     No  Equipment Recommendations   (TBD)    Recommendations for Other Services       Precautions / Restrictions Precautions Precautions: Fall Restrictions LLE Weight Bearing Per Provider Order: Weight bearing as tolerated     Mobility  Bed Mobility Overal bed mobility: Needs Assistance Bed Mobility: Sit to Supine       Sit to supine: Mod assist, Max assist   General bed mobility comments: PT struggled to initiate movement, ultimately needing considerable assist    Transfers Overall transfer level: Needs assistance Equipment used: Rolling walker (2 wheels) Transfers: Sit to/from Stand, Bed to chair/wheelchair/BSC Sit to Stand: Max assist     Squat pivot transfers: Max assist     General transfer comment: Pt initially eager to try but ultimately  fearful/pain hesistant and unable to attain standing on 2 attempts - ultimately maxA transfer back to bed    Ambulation/Gait               General Gait Details: Struggled to get to standing/WBing this afternoon - unable to ambulate   Stairs             Wheelchair Mobility     Tilt Bed    Modified Rankin (Stroke Patients Only)       Balance     Sitting balance-Leahy Scale: Fair                                      Hotel manager: No apparent difficulties  Cognition Arousal: Alert Behavior During Therapy: WFL for tasks assessed/performed                             Following commands: Intact      Cueing    Exercises General Exercises - Lower Extremity Ankle Circles/Pumps: AROM, 10 reps Quad Sets: Strengthening, 10 reps Short Arc Quad: Strengthening, AROM, 10 reps Heel Slides: AAROM, 5 reps (pt able to tolerate more ROM (~50* hip & knee flexion) with lightly resisted leg ext but still very pain hesitant/guarded) Hip ABduction/ADduction: AROM, AAROM, 10 reps    General Comments General comments (skin integrity, edema, etc.): Pt continues to be pain hesitant with any L hip movement  Pertinent Vitals/Pain Pain Assessment Pain Assessment: 0-10 Pain Score: 5  Pain Location: L hip - increases with any hip movement    Home Living                          Prior Function            PT Goals (current goals can now be found in the care plan section) Progress towards PT goals:  (slow progress)    Frequency    BID      PT Plan      Co-evaluation              AM-PAC PT 6 Clicks Mobility   Outcome Measure  Help needed turning from your back to your side while in a flat bed without using bedrails?: A Lot Help needed moving from lying on your back to sitting on the side of a flat bed without using bedrails?: A Lot Help needed moving to and from a bed to a chair  (including a wheelchair)?: A Lot Help needed standing up from a chair using your arms (e.g., wheelchair or bedside chair)?: A Lot Help needed to walk in hospital room?: Total Help needed climbing 3-5 steps with a railing? : Total 6 Click Score: 10    End of Session Equipment Utilized During Treatment: Gait belt Activity Tolerance: Patient tolerated treatment well;Patient limited by pain Patient left: with bed alarm set;with call bell/phone within reach   PT Visit Diagnosis: Muscle weakness (generalized) (M62.81);Difficulty in walking, not elsewhere classified (R26.2);Pain Pain - Right/Left: Left Pain - part of body: Hip     Time: 8256-8198 PT Time Calculation (min) (ACUTE ONLY): 18 min  Charges:    $Therapeutic Exercise: 8-22 mins PT General Charges $$ ACUTE PT VISIT: 1 Visit                     Carmin JONELLE Deed, DPT 01/18/2024, 6:15 PM

## 2024-01-18 NOTE — Evaluation (Signed)
 Occupational Therapy Evaluation Patient Details Name: Sarah Higgins MRN: [id number] DOB: 01-25-1935 Today's Date: 01/18/2024   History of Present Illness   Pt is an 89 year old female s/p fall resulting in closed, displaced intertrochanteric L hip fx s/p intramedullary nailing on 7/8.  PMH of GERD, HTN, HLD, OAB, neuropathy, depression     Clinical Impressions Pt was seen for OT evaluation this date. PTA, pt resides with her spouse in a one level home with 6 STE. She ambulates within the home using a RW and is MOD I/set up assist for ADLs. Her spouse manages IADLs, driving and errands. Son is available to assist as well.  Pt presents to acute OT demonstrating impaired ADL performance and functional mobility 2/2 weakness, pain, balance deficits and low activity tolerance. Pt currently requires Mod A for STS from recliner to RW, with mod cueing for hand/feet placement, anterior weight shift to safely stand. Educated on use of reacher (has one at home) for LB ADLs. Pt needed Max A for LB dressing this date, difficulty following instructions for use of reacher. Edu pt to keep one hand on RW at a time for LB dressing tasks, however she still took both hands off and needs Min/Mod A to maintain dynamic standing balance d/t posterior bias at times. She ambulated ~41ft forward with chair follow before requesting to sit in recliner using RW with MIN A, cueing for sequencing/safe use of RW. Pt would benefit from skilled OT services to address noted impairments and functional limitations to maximize safety and independence while minimizing falls risk and caregiver burden. Do anticipate the need for follow up OT services upon acute hospital DC.      If plan is discharge home, recommend the following:   A lot of help with walking and/or transfers;A little help with walking and/or transfers;A lot of help with bathing/dressing/bathroom     Functional Status Assessment   Patient has had a recent decline in  their functional status and demonstrates the ability to make significant improvements in function in a reasonable and predictable amount of time.     Equipment Recommendations   BSC/3in1;Wheelchair (measurements OT);Hospital bed     Recommendations for Other Services         Precautions/Restrictions   Precautions Precautions: Fall Recall of Precautions/Restrictions: Impaired Restrictions Weight Bearing Restrictions Per Provider Order: Yes LLE Weight Bearing Per Provider Order: Weight bearing as tolerated     Mobility Bed Mobility               General bed mobility comments: NT up in recliner pre/post session    Transfers Overall transfer level: Needs assistance Equipment used: Rolling walker (2 wheels) Transfers: Sit to/from Stand Sit to Stand: Mod assist           General transfer comment: Mod A with max verb cues and tactile cues for sequencing, scooting to edge of chair, pushing up with arms, and forward weight shift; able to maintain standing balance at RW static with CGA, needs Min A for dynamic and safety with ~61ft gait      Balance Overall balance assessment: Needs assistance Sitting-balance support: Feet supported Sitting balance-Leahy Scale: Good Sitting balance - Comments: steady seated in recliner   Standing balance support: Reliant on assistive device for balance, During functional activity, Bilateral upper extremity supported, Single extremity supported Standing balance-Leahy Scale: Poor Standing balance comment: needs CGA/MIN A at RW and unilateral support to maintain balance during LB dressing in standing  ADL either performed or assessed with clinical judgement   ADL Overall ADL's : Needs assistance/impaired     Grooming: Oral care;Sitting;Set up Grooming Details (indicate cue type and reason): in recliner             Lower Body Dressing: Maximal assistance;Sit to/from stand;Sitting/lateral  leans;With adaptive equipment;Cueing for sequencing Lower Body Dressing Details (indicate cue type and reason): Max A to don mesh underwear using reacher with max assist/cueing and education on use of Archivist         Pertinent Vitals/Pain Pain Assessment Pain Assessment: Faces Faces Pain Scale: Hurts little more Pain Descriptors / Indicators: Tender, Sore Pain Intervention(s): Monitored during session, Repositioned     Extremity/Trunk Assessment Upper Extremity Assessment Upper Extremity Assessment: Generalized weakness   Lower Extremity Assessment Lower Extremity Assessment: Generalized weakness       Communication Communication Communication: No apparent difficulties   Cognition Arousal: Alert Behavior During Therapy: WFL for tasks assessed/performed                                 Following commands: Intact       Cueing  General Comments      L hip soreness limiting independence   Exercises Other Exercises Other Exercises: Edu on role of OT in acute setting and use of DME, AE/AD to perform ADLs safely and independently with LLE pain.   Shoulder Instructions      Home Living Family/patient expects to be discharged to:: Private residence (pt preference) Living Arrangements: Spouse/significant other Available Help at Discharge: Family;Available 24 hours/day Type of Home: Mobile home Home Access: Stairs to enter Entrance Stairs-Number of Steps: 6 Entrance Stairs-Rails: Left Home Layout: One level     Bathroom Shower/Tub: Producer, television/film/video: Standard     Home Equipment: Rollator (4 wheels);Cane - single point;Grab bars - tub/shower;Shower seat - built in          Prior Functioning/Environment Prior Level of Function : Needs assist             Mobility Comments: Pt reports that husband does all the driving, running errands, etc but that she  can be up and around in the home; amb with RW ADLs Comments: aide 3x/week assist with laundry, meals, etc    OT Problem List: Decreased strength;Pain;Decreased activity tolerance;Decreased range of motion;Impaired balance (sitting and/or standing);Decreased safety awareness   OT Treatment/Interventions: Self-care/ADL training;Therapeutic exercise;Therapeutic activities;Patient/family education;DME and/or AE instruction;Balance training      OT Goals(Current goals can be found in the care plan section)   Acute Rehab OT Goals Patient Stated Goal: go home OT Goal Formulation: With patient/family Time For Goal Achievement: 02/01/24 Potential to Achieve Goals: Fair ADL Goals Pt Will Perform Lower Body Bathing: sitting/lateral leans;sit to/from stand;with contact guard assist Pt Will Perform Lower Body Dressing: with contact guard assist;sit to/from stand;sitting/lateral leans Pt Will Transfer to Toilet: with contact guard assist;ambulating;regular height toilet;bedside commode Pt Will Perform Toileting - Clothing Manipulation and hygiene: with contact guard assist;sit to/from stand;sitting/lateral leans   OT Frequency:  Min 2X/week    Co-evaluation              AM-PAC OT 6 Clicks Daily  Activity     Outcome Measure Help from another person eating meals?: None Help from another person taking care of personal grooming?: A Little Help from another person toileting, which includes using toliet, bedpan, or urinal?: A Lot Help from another person bathing (including washing, rinsing, drying)?: A Lot Help from another person to put on and taking off regular upper body clothing?: A Little Help from another person to put on and taking off regular lower body clothing?: A Lot 6 Click Score: 16   End of Session Equipment Utilized During Treatment: Gait belt;Rolling walker (2 wheels) Nurse Communication: Mobility status  Activity Tolerance: Patient tolerated treatment well;Patient limited  by pain Patient left: in chair;with call bell/phone within reach;with chair alarm set  OT Visit Diagnosis: Other abnormalities of gait and mobility (R26.89);Muscle weakness (generalized) (M62.81);Unsteadiness on feet (R26.81);Pain Pain - Right/Left: Left Pain - part of body: Leg                Time: 8875-8848 OT Time Calculation (min): 27 min Charges:  OT General Charges $OT Visit: 1 Visit OT Evaluation $OT Eval Moderate Complexity: 1 Mod OT Treatments $Self Care/Home Management : 8-22 mins Kendon Sedeno, OTR/L 01/18/24, 1:36 PM  Jamillia Closson E Laylah Riga 01/18/2024, 1:31 PM

## 2024-01-18 NOTE — Progress Notes (Signed)
  Subjective: 1 Day Post-Op Procedure(s) (LRB): FIXATION, FRACTURE, INTERTROCHANTERIC, WITH INTRAMEDULLARY ROD (Left) Patient reports pain as mild.   Patient is well, and has had no acute complaints or problems PT and care management to assist with discharge planning, SNF vs. HHPT Negative for chest pain and shortness of breath Fever: no Gastrointestinal:Negative for nausea and vomiting Reports she is passing some gas this morning.  Objective: Vital signs in last 24 hours: Temp:  [97.4 F (36.3 C)-98.1 F (36.7 C)] 97.7 F (36.5 C) (07/09 0402) Pulse Rate:  [54-83] 59 (07/09 0402) Resp:  [10-25] 15 (07/09 0402) BP: (87-154)/(49-84) 104/67 (07/09 0402) SpO2:  [89 %-100 %] 100 % (07/09 0402)  Intake/Output from previous day:  Intake/Output Summary (Last 24 hours) at 01/18/2024 0753 Last data filed at 01/18/2024 0400 Gross per 24 hour  Intake 1630.75 ml  Output 75 ml  Net 1555.75 ml    Intake/Output this shift: No intake/output data recorded.  Labs: Recent Labs    01/17/24 0354 01/18/24 0324  HGB 12.7 10.6*   Recent Labs    01/17/24 0354 01/18/24 0324  WBC 15.9* 15.9*  RBC 4.46 3.79*  HCT 38.9 33.0*  PLT 222 176   Recent Labs    01/17/24 0354 01/18/24 0324  NA 136 135  K 4.4 4.6  CL 105 103  CO2 24 25  BUN 17 14  CREATININE 0.51 0.52  GLUCOSE 123* 142*  CALCIUM 9.3 8.8*   No results for input(s): LABPT, INR in the last 72 hours.   EXAM General - Patient is Alert, Appropriate, and Oriented Extremity - ABD soft Neurovascular intact Dorsiflexion/Plantar flexion intact Incision: dressing C/D/I No cellulitis present Compartment soft Dressing/Incision - clean, dry, no drainage noted to the left leg honeycomb dressings. Motor Function - intact, moving foot and toes well on exam.  Abdomen soft with intact bowel sounds.  Past Medical History:  Diagnosis Date   GERD (gastroesophageal reflux disease)    HLD (hyperlipidemia)    Hypertension      Assessment/Plan: 1 Day Post-Op Procedure(s) (LRB): FIXATION, FRACTURE, INTERTROCHANTERIC, WITH INTRAMEDULLARY ROD (Left) Principal Problem:   Closed fracture of femur, intertrochanteric, left, initial encounter (HCC) Active Problems:   HTN (hypertension)   HLD (hyperlipidemia)   Fall   GERD (gastroesophageal reflux disease)   Leukocytosis   Depression   OAB (overactive bladder)  Estimated body mass index is 29.35 kg/m as calculated from the following:   Height as of this encounter: 5' 7 (1.702 m).   Weight as of 10/04/23: 85 kg. Advance diet Up with therapy D/C IV fluids when tolerating po intake.  Labs and vitals reviewed, Hg 10.6, WBC 15.9. Up with therapy today. Continue to work on a BM. Care management to assist with discharge planning.  DVT Prophylaxis - Lovenox  and TED hose Weight-Bearing as tolerated to left leg  J. Gustavo Level, PA-C [facility] Orthopaedic Surgery 01/18/2024, 7:53 AM

## 2024-01-18 NOTE — Evaluation (Signed)
 Physical Therapy Evaluation Patient Details Name: Sarah Higgins MRN: [id number] DOB: Jun 21, 1935 Today's Date: 01/18/2024  History of Present Illness  89 y/o female s/p fall with L hip fx and subsequent ORIF IM nail 7/8.  Clinical Impression  Pt pleasant and interactive, reported minimal pain at rest but was ultimately quite pain limited t/o eval and treat with any L hip movement.  Pt showed good effort but again needed continued cuing and encouragement with HEP (issued paper copy) but she did make good attempt despite the pain.  Pt struggled to shift weight forward and get up into the walker and needed considerable assist to keep weight forward/not fall back.  Pt will benefit from continued PT to address functional mobility issues.        If plan is discharge home, recommend the following: Two people to help with walking and/or transfers;A lot of help with bathing/dressing/bathroom;Assistance with cooking/housework;Help with stairs or ramp for entrance;Assist for transportation   Can travel by private vehicle   No    Equipment Recommendations  (TBD at rehab)  Recommendations for Other Services       Functional Status Assessment Patient has had a recent decline in their functional status and demonstrates the ability to make significant improvements in function in a reasonable and predictable amount of time.     Precautions / Restrictions Precautions Precautions: Fall Recall of Precautions/Restrictions: Impaired Restrictions Weight Bearing Restrictions Per Provider Order: Yes LLE Weight Bearing Per Provider Order: Weight bearing as tolerated      Mobility  Bed Mobility Overal bed mobility: Needs Assistance Bed Mobility: Supine to Sit     Supine to sit: Mod assist     General bed mobility comments: pt able to partially bridge to shift hips toward EOB but needing direct assist with draw sheet to do so and then heavy HHA and trunk assist to assume upright sitting position EOB     Transfers Overall transfer level: Needs assistance Equipment used: Rolling walker (2 wheels) Transfers: Sit to/from Stand Sit to Stand: Mod assist, Max assist           General transfer comment: Pt initially struggling to get weight forward and over walker, she did not follow cues for UE use and appropriate weight shifts to stand despite repeated verbal and tactile cues    Ambulation/Gait Ambulation/Gait assistance: Mod assist Gait Distance (Feet): 5 Feet Assistive device: Rolling walker (2 wheels)         General Gait Details: Pt was able to take a few very guarded and hesitant steps with the walker (chair following) but clearly having L hip pain/hesitancy and poor understanding of walker use/positionging/weight shifts  Stairs            Wheelchair Mobility     Tilt Bed    Modified Rankin (Stroke Patients Only)       Balance Overall balance assessment: Independent                                           Pertinent Vitals/Pain Pain Assessment Pain Assessment: 0-10 Pain Score: 2  Pain Location: Pt reports minimal pain at rest, however with any and all L hip movement she is very guarded and reports significantly increased pain with essentially all L LE activity    Home Living Family/patient expects to be discharged to:: Unsure Living Arrangements: Spouse/significant other Available Help at Discharge:  Family;Available 24 hours/day Type of Home: Mobile home Home Access: Stairs to enter Entrance Stairs-Rails: Left Entrance Stairs-Number of Steps: 6   Home Layout: One level Home Equipment: Rollator (4 wheels);Cane - single point;Grab bars - tub/shower;Shower seat - built in      Prior Function Prior Level of Function : Needs assist             Mobility Comments: Pt reports that husband does all the driving, running errands, etc but that she can be up and around in the home ADLs Comments: aide 3x/week assist with laundry, meals,  etc     Extremity/Trunk Assessment   Upper Extremity Assessment Upper Extremity Assessment: Generalized weakness    Lower Extremity Assessment Lower Extremity Assessment: Generalized weakness (pt with minimal L hip/LE AROM, lacks full ROM in L ankle (fx ~2.5 years ago), guarded with L LE activity)       Communication   Communication Communication: No apparent difficulties    Cognition Arousal: Alert Behavior During Therapy: WFL for tasks assessed/performed   PT - Cognitive impairments: No apparent impairments                       PT - Cognition Comments: Pt reports 2026 for the year, and showed vague confusion but generally displayed good awareness of her situation and followed cues, etc well Following commands: Intact       Cueing       General Comments General comments (skin integrity, edema, etc.): Pt pleasant and engaged, but struggled with functional movement including hesitancy with all L hip activity    Exercises General Exercises - Lower Extremity Ankle Circles/Pumps: AROM, 10 reps (limited L ankle ROM avaialble (lacks DF to neutral)) Quad Sets: Strengthening, 10 reps Short Arc Quad: Strengthening, AROM, 10 reps (pt struggled with hip flexion positioning) Heel Slides: AAROM, PROM, 5 reps (Pt very much struggled with hip flexion movement (strength and pain), limited ROM tolerated - attempts at light resistance for leg ext) Hip ABduction/ADduction: AAROM, AROM, 10 reps (initially AAROM only, improving to AROM with increased warm up/cuing/reps)   Assessment/Plan    PT Assessment Patient needs continued PT services  PT Problem List Decreased strength;Decreased range of motion;Decreased activity tolerance;Decreased balance;Decreased mobility;Decreased knowledge of use of DME;Decreased safety awareness;Pain       PT Treatment Interventions DME instruction;Gait training;Functional mobility training;Therapeutic activities;Therapeutic exercise;Balance  training;Patient/family education    PT Goals (Current goals can be found in the Care Plan section)  Acute Rehab PT Goals Patient Stated Goal: get walking again, get home PT Goal Formulation: With patient Time For Goal Achievement: 01/31/24 Potential to Achieve Goals: Good    Frequency BID     Co-evaluation               AM-PAC PT 6 Clicks Mobility  Outcome Measure Help needed turning from your back to your side while in a flat bed without using bedrails?: A Lot Help needed moving from lying on your back to sitting on the side of a flat bed without using bedrails?: A Lot Help needed moving to and from a bed to a chair (including a wheelchair)?: A Lot Help needed standing up from a chair using your arms (e.g., wheelchair or bedside chair)?: A Lot Help needed to walk in hospital room?: A Lot Help needed climbing 3-5 steps with a railing? : Total 6 Click Score: 11    End of Session Equipment Utilized During Treatment: Gait belt Activity Tolerance: Patient tolerated  treatment well;Patient limited by pain Patient left: with call bell/phone within reach;with chair alarm set;with family/visitor present Nurse Communication: Mobility status;Weight bearing status PT Visit Diagnosis: Muscle weakness (generalized) (M62.81);Difficulty in walking, not elsewhere classified (R26.2);Pain Pain - Right/Left: Left Pain - part of body: Hip    Time: 1018-1050 PT Time Calculation (min) (ACUTE ONLY): 32 min   Charges:   PT Evaluation $PT Eval Low Complexity: 1 Low PT Treatments $Gait Training: 8-22 mins $Therapeutic Exercise: 8-22 mins PT General Charges $$ ACUTE PT VISIT: 1 Visit         Carmin JONELLE Deed, DPT 01/18/2024, 11:19 AM

## 2024-01-18 NOTE — Progress Notes (Signed)
 PROGRESS NOTE    Sarah Higgins   FMW:[id number] DOB: [birth year]  DOA: 01/17/2024 Date of Service: 01/19/24 which is hospital day 2  PCP: System, Provider Not In    Hospital course / significant events:   HPI: Patient with PMH of GERD, HTN, HLD, OAB, neuropathy who presented to the hospital with fall.Patient is from home.  Walks with a walker. While trying to sit down she missed a chair and fell on the left side. To ED w/ L hip pain  07/08: to ED, admitted for L hip fracture. Underwent repair w/ ortho.  07/09: PT/OT recs for SNF    Consultants:  Orthopedics  Procedures/Surgeries: Repair L hip fracture     ASSESSMENT & PLAN:   Closed, displaced, intertrochanteric left hip fracture. Mechanical fall. S/p intramedullary nailing on 7/8. Pain control WBAT  PT/OT  Advance diet  Lovenox , TED hose  Depression. Continue home regimen including Wellbutrin  and Prozac .  Overactive bladder. Continue Ditropan . May have difficulty with Foley removal after the surgery.  HLD. Continue statin.  GERD. Continue PPI.  Leukocytosis. Secondary to stress reaction. Monitor CBC     Overweight/Class 1 obesity based on BMI: Body mass index is 29.35 kg/m.SABRA Significantly low or high BMI is associated with higher medical risk.  Underweight - under 18  overweight - 25 to 29 obese - 30 or more Class 1 obesity: BMI of 30.0 to 34 Class 2 obesity: BMI of 35.0 to 39 Class 3 obesity: BMI of 40.0 to 49 Super Morbid Obesity: BMI 50-59 Super-super Morbid Obesity: BMI 60+ Healthy nutrition and physical activity advised as adjunct to other disease management and risk reduction treatments    DVT prophylaxis: Lovenox , TED hose IV fluids: can d/c continuous IV fluids once taking po  Nutrition: cardiac diet   Central lines / other devices: none  Code Status: FULL CODE ACP documentation reviewed:  none on file in [facility]  [facility] needs: SNF rehab Medical barriers to dispo:  none.           Subjective / Brief ROS:  Patient reports no concerns at this time Denies CP/SOB.  Pain controlled.  Denies new weakness.  Tolerating diet.  Reports no concerns w/ urination/defecation.   Family Communication: none at this time     Objective Findings:  Vitals:   01/18/24 0804 01/18/24 1153 01/18/24 1954 01/19/24 0247  BP: 111/66 (!) 132/59 104/60 (!) 104/51  Pulse: (!) 59 69 63 (!) 58  Resp: 16 18 18 17   Temp: (!) 97.4 F (36.3 C) 98.1 F (36.7 C) 97.8 F (36.6 C) 98 F (36.7 C)  TempSrc: Oral Oral    SpO2: 100% 99% 100% 98%  Height:        Intake/Output Summary (Last 24 hours) at 01/19/2024 0756 Last data filed at 01/18/2024 1429 Gross per 24 hour  Intake 480 ml  Output --  Net 480 ml   There were no vitals filed for this visit.  Examination:  Physical Exam Constitutional:      General: She is not in acute distress. Cardiovascular:     Rate and Rhythm: Normal rate and regular rhythm.  Pulmonary:     Effort: Pulmonary effort is normal.     Breath sounds: Normal breath sounds.  Musculoskeletal:     Right lower leg: No edema.     Left lower leg: No edema.  Skin:    General: Skin is warm and dry.  Neurological:     Mental Status: She is alert. Mental  status is at baseline.  Psychiatric:        Mood and Affect: Mood normal.        Behavior: Behavior normal.          Scheduled Medications:   buPROPion   150 mg Oral q morning   docusate sodium   100 mg Oral BID   enoxaparin  (LOVENOX ) injection  30 mg Subcutaneous Q24H   feeding supplement  237 mL Oral BID BM   FLUoxetine   40 mg Oral Daily   multivitamin with minerals  1 tablet Oral Daily   oxybutynin   10 mg Oral Daily   pantoprazole   40 mg Oral Daily    Continuous Infusions:    PRN Medications:  acetaminophen , bisacodyl , diphenhydrAMINE , HYDROcodone -acetaminophen , magnesium  hydroxide, methocarbamol  **OR** methocarbamol  (ROBAXIN ) injection, metoCLOPramide  **OR**  metoCLOPramide  (REGLAN ) injection, ondansetron  **OR** ondansetron  (ZOFRAN ) IV, sodium phosphate   Antimicrobials from admission:  Anti-infectives (From admission, onward)    Start     Dose/Rate Route Frequency Ordered Stop   01/17/24 2100  ceFAZolin  (ANCEF ) IVPB 2g/100 mL premix        2 g 200 mL/hr over 30 Minutes Intravenous Every 6 hours 01/17/24 1844 01/18/24 0513   01/17/24 1536  ceFAZolin  (ANCEF ) IVPB 2g/100 mL premix        2 g 200 mL/hr over 30 Minutes Intravenous 30 min pre-op 01/17/24 1537 01/17/24 1540           Data Reviewed:  I have personally reviewed the following...  CBC: Recent Labs  Lab 01/17/24 0354 01/18/24 0324  WBC 15.9* 15.9*  NEUTROABS 10.1*  --   HGB 12.7 10.6*  HCT 38.9 33.0*  MCV 87.2 87.1  PLT 222 176   Basic Metabolic Panel: Recent Labs  Lab 01/17/24 0354 01/18/24 0324  NA 136 135  K 4.4 4.6  CL 105 103  CO2 24 25  GLUCOSE 123* 142*  BUN 17 14  CREATININE 0.51 0.52  CALCIUM 9.3 8.8*   GFR: CrCl cannot be calculated (Unknown ideal weight.). Liver Function Tests: No results for input(s): AST, ALT, ALKPHOS, BILITOT, PROT, ALBUMIN in the last 168 hours. No results for input(s): LIPASE, AMYLASE in the last 168 hours. No results for input(s): AMMONIA in the last 168 hours. Coagulation Profile: No results for input(s): INR, PROTIME in the last 168 hours. Cardiac Enzymes: No results for input(s): CKTOTAL, CKMB, CKMBINDEX, TROPONINI in the last 168 hours. BNP (last 3 results) No results for input(s): PROBNP in the last 8760 hours. HbA1C: No results for input(s): HGBA1C in the last 72 hours. CBG: No results for input(s): GLUCAP in the last 168 hours. Lipid Profile: No results for input(s): CHOL, HDL, LDLCALC, TRIG, CHOLHDL, LDLDIRECT in the last 72 hours. Thyroid Function Tests: No results for input(s): TSH, T4TOTAL, FREET4, T3FREE, THYROIDAB in the last 72 hours. Anemia  Panel: No results for input(s): VITAMINB12, FOLATE, FERRITIN, TIBC, IRON, RETICCTPCT in the last 72 hours. Most Recent Urinalysis On File:  No results found for: COLORURINE, APPEARANCEUR, LABSPEC, PHURINE, GLUCOSEU, HGBUR, BILIRUBINUR, KETONESUR, PROTEINUR, UROBILINOGEN, NITRITE, LEUKOCYTESUR Sepsis Labs: @LABRCNTIP (procalcitonin:4,lacticidven:4) Microbiology: No results found for this or any previous visit (from the past 240 hours).    Radiology Studies last 3 days: DG HIP UNILAT WITH PELVIS 2-3 VIEWS LEFT Result Date: 01/17/2024 CLINICAL DATA:  Surgical internal fixation of left hip fracture. EXAM: DG HIP (WITH OR WITHOUT PELVIS) 2-3V LEFT; DG C-ARM 1-60 MIN-NO REPORT Radiation exposure index: 2.4207 mGy. COMPARISON:  Same day. FINDINGS: Four intraoperative fluoroscopic images were obtained of the  left hip and femur. These images demonstrate surgical internal fixation of proximal left femoral intertrochanteric fracture with intramedullary rod fixation of left femur. IMPRESSION: Fluoroscopic guidance provided during surgical internal fixation of proximal left femoral fracture Electronically Signed   By: Lynwood Landy Raddle M.D.   On: 01/17/2024 17:26   DG C-Arm 1-60 Min-No Report Result Date: 01/17/2024 Fluoroscopy was utilized by the requesting physician.  No radiographic interpretation.   DG Chest 1 View Result Date: 01/17/2024 CLINICAL DATA:  Hip fracture.  Preoperative respiratory evaluation. EXAM: CHEST  1 VIEW COMPARISON:  11/30/2020 FINDINGS: Chronic atelectasis or scarring noted in the parahilar right lung, similar to prior. Ill-defined airspace opacity in the right upper lung is also similar. Left lung remains clear. Cardiopericardial silhouette is at upper limits of normal for size. Small hiatal hernia evident. Bones are diffusely demineralized. Telemetry leads overlie the chest. IMPRESSION: 1. Chronic atelectasis or scarring in the parahilar right lung with  ill-defined airspace opacity in the right upper lung. No significant change from prior chest x-ray. Remote chest CT from 20/12 showed prominent tree-in-bud nodularity with airway impaction in the right upper and lower lobes compatible with bronchopneumonia/atypical infection. 2. Small hiatal hernia. Electronically Signed   By: Camellia Candle M.D.   On: 01/17/2024 06:12   DG Hip Unilat W or Wo Pelvis 2-3 Views Left Result Date: 01/17/2024 CLINICAL DATA:  Fall.  Severe left hip pain. EXAM: DG HIP (WITH OR WITHOUT PELVIS) 2-3V LEFT COMPARISON:  12/01/2020 FINDINGS: Bones are diffusely demineralized. SI joints and symphysis pubis unremarkable. No evidence for pubic ramus fracture. AP and frog-leg lateral views of the left hip show a intertrochanteric comminuted left proximal femur fracture with varus angulation. IMPRESSION: Intertrochanteric comminuted left proximal femur fracture with varus angulation. Electronically Signed   By: Camellia Candle M.D.   On: 01/17/2024 06:10           Marah Park, DO [facility] 01/19/2024, 7:56 AM    Dictation software may have been used to generate the above note. Typos may occur and escape review in typed/dictated notes. Please contact Dr Marsa directly for clarity if needed.  Staff may message me via secure chat in Epic  but this may not receive an immediate response,  please page me for urgent matters!  If 7PM-7AM, please contact night coverage [email or web address]

## 2024-01-19 DIAGNOSIS — S72142A Displaced intertrochanteric fracture of left femur, initial encounter for closed fracture: Secondary | ICD-10-CM | POA: Diagnosis not present

## 2024-01-19 MED ORDER — GUAIFENESIN-DM 100-10 MG/5ML PO SYRP
5.0000 mL | ORAL_SOLUTION | ORAL | Status: DC | PRN
  Administered 2024-01-19 – 2024-01-20 (×2): 5 mL via ORAL
  Filled 2024-01-19 (×2): qty 10

## 2024-01-19 MED ORDER — HYDROCODONE-ACETAMINOPHEN 5-325 MG PO TABS: 1.0000 | ORAL_TABLET | ORAL | 0 refills | Status: AC | PRN

## 2024-01-19 MED ORDER — ENOXAPARIN SODIUM 30 MG/0.3ML IJ SOSY: 30.0000 mg | PREFILLED_SYRINGE | INTRAMUSCULAR | 0 refills | Status: AC

## 2024-01-19 NOTE — Plan of Care (Signed)

## 2024-01-19 NOTE — Progress Notes (Signed)
 PROGRESS NOTE    Sarah Higgins   FMW:[id number] DOB: 1935-02-23  DOA: 01/17/2024 Date of Service: 01/19/24 which is hospital day 2  PCP: System, Provider Not In    Hospital course / significant events:   HPI: Patient with PMH of GERD, HTN, HLD, OAB, neuropathy who presented to the hospital with fall.Patient is from home.  Walks with a walker. While trying to sit down she missed a chair and fell on the left side. To ED w/ L hip pain  07/08: to ED, admitted for L hip fracture. Underwent repair w/ ortho.  07/09-07/10: PT/OT recs for SNF, pt prefers for home health and has family support, son will be coming to help. HH/DME in process, son arriving later 07/10   Consultants:  Orthopedics  Procedures/Surgeries: Repair L hip fracture     ASSESSMENT & PLAN:   Closed, displaced, intertrochanteric left hip fracture. Mechanical fall. S/p intramedullary nailing on 7/8. Pain control WBAT  PT/OT - requiring significant assistance, son to come this evening appreciate if PT/OT can demonstrate needs  Lovenox , TED hose  Depression. Continue home regimen including Wellbutrin  and Prozac .  Overactive bladder. Continue Ditropan . May have difficulty with Foley removal after the surgery.  HLD. Continue statin.  GERD. Continue PPI.  Leukocytosis. Secondary to stress reaction. Monitor CBC     Overweight/Class 1 obesity based on BMI: Body mass index is 29.35 kg/m.SABRA Significantly low or high BMI is associated with higher medical risk.  Underweight - under 18  overweight - 25 to 29 obese - 30 or more Class 1 obesity: BMI of 30.0 to 34 Class 2 obesity: BMI of 35.0 to 39 Class 3 obesity: BMI of 40.0 to 49 Super Morbid Obesity: BMI 50-59 Super-super Morbid Obesity: BMI 60+ Healthy nutrition and physical activity advised as adjunct to other disease management and risk reduction treatments    DVT prophylaxis: Lovenox , TED hose IV fluids: can d/c continuous IV fluids once taking  po  Nutrition: cardiac diet   Central lines / other devices: none  Code Status: FULL CODE ACP documentation reviewed:  none on file in [facility]CA  [facility] needs: SNF rehab declined, HH/DME in process  Medical barriers to dispo: none.           Subjective / Brief ROS:  Patient reports no concerns at this time Denies CP/SOB.  Pain controlled.  Denies new weakness.  Tolerating diet.    Family Communication: none at this time     Objective Findings:  Vitals:   01/18/24 1153 01/18/24 1954 01/19/24 0247 01/19/24 0835  BP: (!) 132/59 104/60 (!) 104/51 128/71  Pulse: 69 63 (!) 58 78  Resp: 18 18 17 16   Temp: 98.1 F (36.7 C) 97.8 F (36.6 C) 98 F (36.7 C) 98 F (36.7 C)  TempSrc: Oral   Oral  SpO2: 99% 100% 98% 99%  Height:        Intake/Output Summary (Last 24 hours) at 01/19/2024 1256 Last data filed at 01/19/2024 9162 Gross per 24 hour  Intake 240 ml  Output 400 ml  Net -160 ml   There were no vitals filed for this visit.  Examination:  Physical Exam Constitutional:      General: She is not in acute distress. Cardiovascular:     Rate and Rhythm: Normal rate and regular rhythm.  Pulmonary:     Effort: Pulmonary effort is normal.     Breath sounds: Normal breath sounds.  Musculoskeletal:     Right lower leg: No edema.  Left lower leg: No edema.  Skin:    General: Skin is warm and dry.  Neurological:     Mental Status: She is alert. Mental status is at baseline.  Psychiatric:        Mood and Affect: Mood normal.        Behavior: Behavior normal.          Scheduled Medications:   buPROPion   150 mg Oral q morning   docusate sodium   100 mg Oral BID   enoxaparin  (LOVENOX ) injection  30 mg Subcutaneous Q24H   feeding supplement  237 mL Oral BID BM   FLUoxetine   40 mg Oral Daily   multivitamin with minerals  1 tablet Oral Daily   oxybutynin   10 mg Oral Daily   pantoprazole   40 mg Oral Daily    Continuous Infusions:    PRN Medications:   acetaminophen , bisacodyl , diphenhydrAMINE , guaiFENesin -dextromethorphan , HYDROcodone -acetaminophen , magnesium  hydroxide, methocarbamol  **OR** methocarbamol  (ROBAXIN ) injection, metoCLOPramide  **OR** metoCLOPramide  (REGLAN ) injection, ondansetron  **OR** ondansetron  (ZOFRAN ) IV, sodium phosphate   Antimicrobials from admission:  Anti-infectives (From admission, onward)    Start     Dose/Rate Route Frequency Ordered Stop   01/17/24 2100  ceFAZolin  (ANCEF ) IVPB 2g/100 mL premix        2 g 200 mL/hr over 30 Minutes Intravenous Every 6 hours 01/17/24 1844 01/18/24 0513   01/17/24 1536  ceFAZolin  (ANCEF ) IVPB 2g/100 mL premix        2 g 200 mL/hr over 30 Minutes Intravenous 30 min pre-op 01/17/24 1537 01/17/24 1540           Data Reviewed:  I have personally reviewed the following...  CBC: Recent Labs  Lab 01/17/24 0354 01/18/24 0324  WBC 15.9* 15.9*  NEUTROABS 10.1*  --   HGB 12.7 10.6*  HCT 38.9 33.0*  MCV 87.2 87.1  PLT 222 176   Basic Metabolic Panel: Recent Labs  Lab 01/17/24 0354 01/18/24 0324  NA 136 135  K 4.4 4.6  CL 105 103  CO2 24 25  GLUCOSE 123* 142*  BUN 17 14  CREATININE 0.51 0.52  CALCIUM 9.3 8.8*   GFR: CrCl cannot be calculated (Unknown ideal weight.). Liver Function Tests: No results for input(s): AST, ALT, ALKPHOS, BILITOT, PROT, ALBUMIN in the last 168 hours. No results for input(s): LIPASE, AMYLASE in the last 168 hours. No results for input(s): AMMONIA in the last 168 hours. Coagulation Profile: No results for input(s): INR, PROTIME in the last 168 hours. Cardiac Enzymes: No results for input(s): CKTOTAL, CKMB, CKMBINDEX, TROPONINI in the last 168 hours. BNP (last 3 results) No results for input(s): PROBNP in the last 8760 hours. HbA1C: No results for input(s): HGBA1C in the last 72 hours. CBG: No results for input(s): GLUCAP in the last 168 hours. Lipid Profile: No results for input(s): CHOL,  HDL, LDLCALC, TRIG, CHOLHDL, LDLDIRECT in the last 72 hours. Thyroid Function Tests: No results for input(s): TSH, T4TOTAL, FREET4, T3FREE, THYROIDAB in the last 72 hours. Anemia Panel: No results for input(s): VITAMINB12, FOLATE, FERRITIN, TIBC, IRON, RETICCTPCT in the last 72 hours. Most Recent Urinalysis On File:  No results found for: COLORURINE, APPEARANCEUR, LABSPEC, PHURINE, GLUCOSEU, HGBUR, BILIRUBINUR, KETONESUR, PROTEINUR, UROBILINOGEN, NITRITE, LEUKOCYTESUR Sepsis Labs: @LABRCNTIP (procalcitonin:4,lacticidven:4) Microbiology: No results found for this or any previous visit (from the past 240 hours).    Radiology Studies last 3 days: DG HIP UNILAT WITH PELVIS 2-3 VIEWS LEFT Result Date: 01/17/2024 CLINICAL DATA:  Surgical internal fixation of left hip fracture. EXAM: DG HIP (  WITH OR WITHOUT PELVIS) 2-3V LEFT; DG C-ARM 1-60 MIN-NO REPORT Radiation exposure index: 2.4207 mGy. COMPARISON:  Same day. FINDINGS: Four intraoperative fluoroscopic images were obtained of the left hip and femur. These images demonstrate surgical internal fixation of proximal left femoral intertrochanteric fracture with intramedullary rod fixation of left femur. IMPRESSION: Fluoroscopic guidance provided during surgical internal fixation of proximal left femoral fracture Electronically Signed   By: Lynwood Landy Raddle M.D.   On: 01/17/2024 17:26   DG C-Arm 1-60 Min-No Report Result Date: 01/17/2024 Fluoroscopy was utilized by the requesting physician.  No radiographic interpretation.   DG Chest 1 View Result Date: 01/17/2024 CLINICAL DATA:  Hip fracture.  Preoperative respiratory evaluation. EXAM: CHEST  1 VIEW COMPARISON:  11/30/2020 FINDINGS: Chronic atelectasis or scarring noted in the parahilar right lung, similar to prior. Ill-defined airspace opacity in the right upper lung is also similar. Left lung remains clear. Cardiopericardial silhouette is at upper  limits of normal for size. Small hiatal hernia evident. Bones are diffusely demineralized. Telemetry leads overlie the chest. IMPRESSION: 1. Chronic atelectasis or scarring in the parahilar right lung with ill-defined airspace opacity in the right upper lung. No significant change from prior chest x-ray. Remote chest CT from 20/12 showed prominent tree-in-bud nodularity with airway impaction in the right upper and lower lobes compatible with bronchopneumonia/atypical infection. 2. Small hiatal hernia. Electronically Signed   By: Camellia Candle M.D.   On: 01/17/2024 06:12   DG Hip Unilat W or Wo Pelvis 2-3 Views Left Result Date: 01/17/2024 CLINICAL DATA:  Fall.  Severe left hip pain. EXAM: DG HIP (WITH OR WITHOUT PELVIS) 2-3V LEFT COMPARISON:  12/01/2020 FINDINGS: Bones are diffusely demineralized. SI joints and symphysis pubis unremarkable. No evidence for pubic ramus fracture. AP and frog-leg lateral views of the left hip show a intertrochanteric comminuted left proximal femur fracture with varus angulation. IMPRESSION: Intertrochanteric comminuted left proximal femur fracture with varus angulation. Electronically Signed   By: Camellia Candle M.D.   On: 01/17/2024 06:10           Joffrey Kerce, DO [facility] 01/19/2024, 12:56 PM    Dictation software may have been used to generate the above note. Typos may occur and escape review in typed/dictated notes. Please contact Dr Marsa directly for clarity if needed.  Staff may message me via secure chat in Epic  but this may not receive an immediate response,  please page me for urgent matters!  If 7PM-7AM, please contact night coverage [email or web address]

## 2024-01-19 NOTE — TOC Transition Note (Addendum)
 Transition of Care [facility]) - Progression Note    Patient Details  Name: Sarah Higgins MRN: [id number] Date of Birth: Jan 02, 1935  Transition of [facility]) CM/SW Contact  [place] Mazzy Santarelli, [place] Phone Number: 01/19/2024, 3:17 PM  Clinical Narrative:     DME ordered for patient. HH setup with [facility] for PT (physical therapy) and OT. CSW spoke with patient husband and he advised that patient has a safe place to sleep. CSW spoke with attending to determine  if hospital bed was medically necessary. CSW advised that patient made the suggestion and not medical team.   Spouse contacted and CSW was advised that patient has a bed at home that she can safely sleep on until hospital bed is approved/delivered/set up.   TOC signing off.       Expected Discharge Plan and Services                                               Social Determinants of Health (SDOH) Interventions SDOH Screenings   Food Insecurity: No Food Insecurity (06/15/2020)   Received from [facility]  Housing: Unknown (10/04/2023)   Received from [facility]  Transportation Needs: No Transportation Needs (10/23/2018)   Received from [facility]  Financial Resource Strain: Low Risk  (10/23/2018)   Received from [facility]  Physical Activity: Inactive (10/23/2018)   Received from [facility]  Social Connections: Unknown (01/17/2024)  Stress: No Stress Concern Present (10/23/2018)   Received from [facility]  Tobacco Use: Low Risk  (01/17/2024)  Health Literacy: Low Risk  (10/17/2020)   Received from [facility]    Readmission Risk Interventions     No data to display

## 2024-01-19 NOTE — Progress Notes (Signed)
 Physical Therapy Treatment Patient Details Name: Sarah Higgins MRN: [id number] DOB: 05/31/1935 Today's Date: 01/19/2024   History of Present Illness Pt is an 89 year old female s/p fall resulting in closed, displaced intertrochanteric L hip fx s/p intramedullary nailing on 7/8.  PMH of GERD, HTN, HLD, OAB, neuropathy, depression    PT Comments  Pt received for p.m. session. Declining OOB mobility this date due to recently getting back to bed with NSG. Is able and willing to review HEP as performed below. Needs intermittent AAROM for some ranges/planes due to L hip pain. Pt overall not very participatory and does not seem very interested in PT. Downgrading frequency from BID to 7x/week due to participation. Pt with all needs in reach upon exit. D/c recs remain appropriate.    If plan is discharge home, recommend the following: Two people to help with walking and/or transfers;A lot of help with bathing/dressing/bathroom;Assistance with cooking/housework;Help with stairs or ramp for entrance;Assist for transportation   Can travel by private vehicle     No  Equipment Recommendations  Rolling walker (2 wheels);BSC/3in1;Hospital bed    Recommendations for Other Services       Precautions / Restrictions Precautions Precautions: Fall Recall of Precautions/Restrictions: Impaired Restrictions Weight Bearing Restrictions Per Provider Order: Yes LLE Weight Bearing Per Provider Order: Weight bearing as tolerated     Mobility  Bed Mobility Overal bed mobility: Needs Assistance Bed Mobility: Supine to Sit     Supine to sit: Max assist, HOB elevated, Used rails     General bed mobility comments: PT struggled to initiate movement, ultimately needing considerable assist Patient Response: Cooperative, Flat affect  Transfers Overall transfer level: Needs assistance Equipment used: Rolling walker (2 wheels) Transfers: Sit to/from Stand, Bed to chair/wheelchair/BSC Sit to Stand: Max assist            General transfer comment: able to take side steps to recliner with minA on RW    Ambulation/Gait               General Gait Details: deferred due to difficulty with transfer   Stairs             Wheelchair Mobility     Tilt Bed Tilt Bed Patient Response: Cooperative, Flat affect  Modified Rankin (Stroke Patients Only)       Balance Overall balance assessment: Needs assistance Sitting-balance support: Feet supported, Bilateral upper extremity supported Sitting balance-Leahy Scale: Poor Sitting balance - Comments: R lateral lean. Reliant on BUE support. Postural control: Posterior lean, Right lateral lean Standing balance support: Reliant on assistive device for balance, During functional activity, Bilateral upper extremity supported, Single extremity supported Standing balance-Leahy Scale: Poor Standing balance comment: Reliant on minA on RW and gait belt to maintain standing                            Communication Communication Communication: No apparent difficulties  Cognition Arousal: Alert Behavior During Therapy: WFL for tasks assessed/performed   PT - Cognitive impairments: No apparent impairments                       PT - Cognition Comments: not very motivated to participate Following commands: Intact      Cueing Cueing Techniques: Verbal cues  Exercises General Exercises - Lower Extremity Ankle Circles/Pumps: AROM, 10 reps, Supine, Left Quad Sets: Strengthening, 10 reps, Supine, Left Gluteal Sets: AROM, Both, 10 reps, Supine Short  Arc Quad: Strengthening, AROM, 10 reps, Supine, Left Heel Slides: AAROM, Left, 10 reps, Supine Hip ABduction/ADduction: AAROM, Left, 10 reps, Supine Other Exercises Other Exercises: continuing to educate and encourage STR recs. Discussed on DME needs and physical support in order to safely manage at home. Pt appears to ahve poor understanding of situation and risk for  deconditioning and falls.    General Comments        Pertinent Vitals/Pain Pain Assessment Pain Assessment: Faces Faces Pain Scale: Hurts a little bit Pain Location: L hip - increases with any hip movement Pain Descriptors / Indicators: Tender, Sore, Discomfort, Guarding Pain Intervention(s): Limited activity within patient's tolerance, Monitored during session, Repositioned    Home Living                          Prior Function            PT Goals (current goals can now be found in the care plan section) Acute Rehab PT Goals Patient Stated Goal: get walking again, get home PT Goal Formulation: With patient Time For Goal Achievement: 01/31/24 Potential to Achieve Goals: Fair Progress towards PT goals: Not progressing toward goals - comment    Frequency    7X/week      PT Plan      Co-evaluation              AM-PAC PT 6 Clicks Mobility   Outcome Measure  Help needed turning from your back to your side while in a flat bed without using bedrails?: A Lot Help needed moving from lying on your back to sitting on the side of a flat bed without using bedrails?: A Lot Help needed moving to and from a bed to a chair (including a wheelchair)?: A Lot Help needed standing up from a chair using your arms (e.g., wheelchair or bedside chair)?: A Lot Help needed to walk in hospital room?: Total Help needed climbing 3-5 steps with a railing? : Total 6 Click Score: 10    End of Session   Activity Tolerance: Other (comment) (deferring OOB mobility due to recently transferring back to recliner.) Patient left: in bed;with call bell/phone within reach;with bed alarm set Nurse Communication: Mobility status PT Visit Diagnosis: Muscle weakness (generalized) (M62.81);Difficulty in walking, not elsewhere classified (R26.2);Pain Pain - Right/Left: Left Pain - part of body: Hip     Time: 1435-1447 PT Time Calculation (min) (ACUTE ONLY): 12 min  Charges:     $Therapeutic Exercise: 8-22 mins PT General Charges $$ ACUTE PT VISIT: 1 Visit                     Dorina HERO. Fairly IV, PT, DPT Physical Therapist- [place]  [facility] 01/19/2024, 3:42 PM

## 2024-01-19 NOTE — Progress Notes (Signed)
   01/19/24 1000  Spiritual Encounters  Type of Visit Initial  Care provided to: Pt and family (Husband at bedside)  Referral source Chaplain team  Reason for visit Routine spiritual support  OnCall Visit Yes  Spiritual Framework  Presenting Themes Meaning/purpose/sources of inspiration;Values and beliefs  Interventions  Spiritual Care Interventions Made Established relationship of care and support;Compassionate presence;Reflective listening;Explored values/beliefs/practices/strengths;Encouragement  Intervention Outcomes  Outcomes Connection to spiritual care;Awareness around self/spiritual resourses;Awareness of support

## 2024-01-19 NOTE — TOC Progression Note (Addendum)
 Transition of Care [facility]) - Progression Note    Patient Details  Name: Sarah Higgins MRN: [id number] Date of Birth: Oct 27, 1934  Transition of Care [facility]) CM/SW Contact  [place] Jackolyn Geron, [place] Phone Number: 01/19/2024, 7:28 AM  Clinical Narrative:     CSW met with patient to complete a brief assessment. Spouse was at bedside. CSW explained role and HIPAA. Medications are sent via mail order service. Patient denied a need for medication assistance. Patient advised that she is already receiving home care and receives services in the home several times a week. She stated that she is assisted with bathing and getting dressed. She also reported receiving PT.   Spouse appears to be very supportive. Patient advised that her son is moving into the home for a short while to help take care of her. Patient was adamant that she is discharging home and resuming services that are already in place. SNF placement was declined.   No preference for HH. Patient receiving PCS services with [facility]).         Expected Discharge Plan and Services                                               Social Determinants of Health (SDOH) Interventions SDOH Screenings   Food Insecurity: No Food Insecurity (06/15/2020)   Received from [facility]  Housing: Unknown (10/04/2023)   Received from [facility]  Transportation Needs: No Transportation Needs (10/23/2018)   Received from [facility]  Financial Resource Strain: Low Risk  (10/23/2018)   Received from [facility]  Physical Activity: Inactive (10/23/2018)   Received from [facility]  Social Connections: Unknown (01/17/2024)  Stress: No Stress Concern Present (10/23/2018)   Received from [facility]  Tobacco Use: Low Risk  (01/17/2024)  Health Literacy: Low Risk  (10/17/2020)   Received from [facility]    Readmission Risk Interventions     No data to display

## 2024-01-19 NOTE — Progress Notes (Signed)
 Physical Therapy Treatment Patient Details Name: Sarah Higgins MRN: [id number] DOB: [birth year] Today's Date: 01/19/2024   History of Present Illness Pt is an 89 year old female s/p fall resulting in closed, displaced intertrochanteric L hip fx s/p intramedullary nailing on 7/8.  PMH of GERD, HTN, HLD, OAB, neuropathy, depression    PT Comments  Pt received upright in bed agreeable to PT. Reports GI pain but does not directly have c/o L hip pain at rest. Pt remains needing heavy physical assist for supine to sit at torso and LLE in small, incremental parts. Poor static sitting appreciated with heavy posterior and R lateral lean needing PRN CGA for safe static sitting. Reliant on bed elevated and maxA+1 for x2 STS at EOB with very heavy posterior  bias needing max facilitation on pelvis for neutral standing with fair ability to take lateral steps and perform SPT with minA on RW for facilitation to sit in recliner with poor eccentric descent. Pt declining HEP exercises. PT educating pt on DME needs and support required due to pt declining STR d/c recs. Pt appears to have very poor situational awareness on current functional capacity. Discussed concerns for pt having injury due to being a high falls risk. Pt left with all needs in reach. Attending MD updated on DME needs if pt plans to d/c home. However current d/c recs remain encouraged and recommended.     If plan is discharge home, recommend the following: Two people to help with walking and/or transfers;A lot of help with bathing/dressing/bathroom;Assistance with cooking/housework;Help with stairs or ramp for entrance;Assist for transportation   Can travel by private vehicle     No  Equipment Recommendations  Rolling walker (2 wheels);BSC/3in1;Hospital bed    Recommendations for Other Services       Precautions / Restrictions Precautions Precautions: Fall Recall of Precautions/Restrictions: Impaired Restrictions Weight Bearing Restrictions  Per Provider Order: Yes LLE Weight Bearing Per Provider Order: Weight bearing as tolerated     Mobility  Bed Mobility Overal bed mobility: Needs Assistance Bed Mobility: Supine to Sit     Supine to sit: Max assist, HOB elevated, Used rails     General bed mobility comments: PT struggled to initiate movement, ultimately needing considerable assist Patient Response: Cooperative, Flat affect  Transfers Overall transfer level: Needs assistance Equipment used: Rolling walker (2 wheels) Transfers: Sit to/from Stand, Bed to chair/wheelchair/BSC Sit to Stand: Max assist           General transfer comment: able to take side steps to recliner with minA on RW    Ambulation/Gait               General Gait Details: deferred due to difficulty with transfer   Stairs             Wheelchair Mobility     Tilt Bed Tilt Bed Patient Response: Cooperative, Flat affect  Modified Rankin (Stroke Patients Only)       Balance Overall balance assessment: Needs assistance Sitting-balance support: Feet supported, Bilateral upper extremity supported Sitting balance-Leahy Scale: Poor Sitting balance - Comments: R lateral lean. Reliant on BUE support. Postural control: Posterior lean, Right lateral lean Standing balance support: Reliant on assistive device for balance, During functional activity, Bilateral upper extremity supported, Single extremity supported Standing balance-Leahy Scale: Poor Standing balance comment: Reliant on minA on RW and gait belt to maintain standing  Communication Communication Communication: No apparent difficulties  Cognition Arousal: Alert Behavior During Therapy: WFL for tasks assessed/performed   PT - Cognitive impairments: No apparent impairments                         Following commands: Intact      Cueing Cueing Techniques: Verbal cues  Exercises Other Exercises Other Exercises:  continuing to educate and encourage STR recs. Discussed on DME needs and physical support in order to safely manage at home. Pt appears to ahve poor understanding of situation and risk for deconditioning and falls.    General Comments        Pertinent Vitals/Pain Pain Assessment Pain Assessment: Faces Faces Pain Scale: Hurts little more Pain Location: L hip - increases with any hip movement Pain Descriptors / Indicators: Tender, Sore, Discomfort Pain Intervention(s): Monitored during session, Repositioned    Home Living                          Prior Function            PT Goals (current goals can now be found in the care plan section) Acute Rehab PT Goals Patient Stated Goal: get walking again, get home PT Goal Formulation: With patient Time For Goal Achievement: 01/31/24 Potential to Achieve Goals: Good Progress towards PT goals: Not progressing toward goals - comment (remains unable to ambulate)    Frequency    BID      PT Plan      Co-evaluation              AM-PAC PT 6 Clicks Mobility   Outcome Measure  Help needed turning from your back to your side while in a flat bed without using bedrails?: A Lot Help needed moving from lying on your back to sitting on the side of a flat bed without using bedrails?: A Lot Help needed moving to and from a bed to a chair (including a wheelchair)?: A Lot Help needed standing up from a chair using your arms (e.g., wheelchair or bedside chair)?: A Lot Help needed to walk in hospital room?: Total Help needed climbing 3-5 steps with a railing? : Total 6 Click Score: 10    End of Session Equipment Utilized During Treatment: Gait belt Activity Tolerance: Patient limited by pain Patient left: in chair;with call bell/phone within reach;with chair alarm set Nurse Communication: Mobility status PT Visit Diagnosis: Muscle weakness (generalized) (M62.81);Difficulty in walking, not elsewhere classified  (R26.2);Pain Pain - Right/Left: Left Pain - part of body: Hip     Time: 0925-0950 PT Time Calculation (min) (ACUTE ONLY): 25 min  Charges:    $Therapeutic Activity: 8-22 mins $Self Care/Home Management: 8-22 PT General Charges $$ ACUTE PT VISIT: 1 Visit                     Dorina HERO. Fairly IV, PT, DPT Physical Therapist- [place]  [facility] 01/19/2024, 11:01 AM

## 2024-01-19 NOTE — Progress Notes (Signed)
 Occupational Therapy Treatment Patient Details Name: Sarah Higgins MRN: [id number] DOB: 1935/06/23 Today's Date: 01/19/2024   History of present illness Pt is an 89 year old female s/p fall resulting in closed, displaced intertrochanteric L hip fx s/p intramedullary nailing on 7/8.  PMH of GERD, HTN, HLD, OAB, neuropathy, depression   OT comments  Pt is seated in recliner on arrival. Pleasant and agreeable to OT session with spouse present. She has 7/10 L hip pain. Pt continues to desire to return home with family assist and [facility] services and seems to have poor awareness of her deficits. Spouse reports he was helping her with dressing, etc., at home and that she has posterior lean at baseline and always falls backwards. Edu on recommendation for STR to maximize her strength and safe recovery to eventually return home, as well as need for [facility], W/C, and hospital bed for safe DC home. Pt performed x2 STS trials from recliner this date, once with Max A x1, multimodal cueing for hand/feet placement and safety with significant posterior lean and R lean d/t guarding of L hip. 2nd attempt pt required Max A x2 with assist from spouse to stand from recliner and Min A to ambulate ~3 ft with RW management and cueing for sequencing with chair follow. Pt returned to recliner with all needs in place and will cont to require skilled acute OT services to maximize her safety and IND to return to PLOF.       If plan is discharge home, recommend the following:  A lot of help with walking and/or transfers;A little help with walking and/or transfers;A lot of help with bathing/dressing/bathroom   Equipment Recommendations  BSC/3in1;Wheelchair (measurements OT);Hospital bed    Recommendations for Other Services      Precautions / Restrictions Precautions Precautions: Fall Recall of Precautions/Restrictions: Impaired Restrictions Weight Bearing Restrictions Per Provider Order: Yes LLE Weight Bearing Per Provider  Order: Weight bearing as tolerated       Mobility Bed Mobility               General bed mobility comments: NT up in recliner pre/post session    Transfers Overall transfer level: Needs assistance Equipment used: Rolling walker (2 wheels) Transfers: Sit to/from Stand Sit to Stand: Max assist           General transfer comment: pt performed x2 STS trials from recliner this date, once with Max A x1, multimodal cueing for hand/feet placement and safety with significant posterior lean and R lean d/t guarding of L hip; 2nd attempt pt Max A x2 with assist from spouse to stand from recliner and Min A to ambulate ~3 ft with RW management and cueing for sequencing with chair follow     Balance Overall balance assessment: Needs assistance Sitting-balance support: Feet supported, Bilateral upper extremity supported Sitting balance-Leahy Scale: Poor Sitting balance - Comments: R lateral and posterior lean Postural control: Posterior lean, Right lateral lean Standing balance support: Reliant on assistive device for balance, Bilateral upper extremity supported Standing balance-Leahy Scale: Poor Standing balance comment: Max A with initial stands d/t posterior lean, able to progress to Min A with RW use once upright posture obtained                           ADL either performed or assessed with clinical judgement   ADL Overall ADL's : Needs assistance/impaired  General ADL Comments: spouse present for session with increased education provided on pt's need for assist with all ADLs at this time d/t pain and weakness    Extremity/Trunk Assessment              Vision       Perception     Praxis     Communication Communication Communication: No apparent difficulties   Cognition Arousal: Alert Behavior During Therapy: WFL for tasks assessed/performed                                 Following  commands: Intact        Cueing   Cueing Techniques: Verbal cues  Exercises Other Exercises Other Exercises: Extensive conversation with pt/spouse on DC recommendations and DME needs and risk of injury for pt and caregivers.    Shoulder Instructions       General Comments      Pertinent Vitals/ Pain       Pain Assessment Pain Assessment: 0-10 Pain Score: 7  Pain Location: L hip - increases with any hip movement Pain Descriptors / Indicators: Tender, Sore, Discomfort, Guarding Pain Intervention(s): Limited activity within patient's tolerance, Monitored during session, Repositioned  Home Living                                          Prior Functioning/Environment              Frequency  Min 2X/week        Progress Toward Goals  OT Goals(current goals can now be found in the care plan section)  Progress towards OT goals: Progressing toward goals  Acute Rehab OT Goals Patient Stated Goal: go home OT Goal Formulation: With patient Time For Goal Achievement: 02/01/24 Potential to Achieve Goals: Fair  Plan      Co-evaluation                 AM-PAC OT 6 Clicks Daily Activity     Outcome Measure   Help from another person eating meals?: None Help from another person taking care of personal grooming?: A Little Help from another person toileting, which includes using toliet, bedpan, or urinal?: A Lot Help from another person bathing (including washing, rinsing, drying)?: A Lot Help from another person to put on and taking off regular upper body clothing?: A Little Help from another person to put on and taking off regular lower body clothing?: A Lot 6 Click Score: 16    End of Session Equipment Utilized During Treatment: Gait belt;Rolling walker (2 wheels)  OT Visit Diagnosis: Other abnormalities of gait and mobility (R26.89);Muscle weakness (generalized) (M62.81);Unsteadiness on feet (R26.81);Pain Pain - Right/Left: Left Pain -  part of body: Leg   Activity Tolerance Patient tolerated treatment well;Patient limited by pain   Patient Left in chair;with call bell/phone within reach;with chair alarm set   Nurse Communication Mobility status        Time: 8886-8862 OT Time Calculation (min): 24 min  Charges: OT General Charges $OT Visit: 1 Visit OT Treatments $Therapeutic Activity: 23-37 mins  Merlin Golden, OTR/L  01/19/24, 1:58 PM   Hind Chesler E Elona Yinger 01/19/2024, 1:48 PM

## 2024-01-19 NOTE — Progress Notes (Signed)
  Subjective: 2 Days Post-Op Procedure(s) (LRB): FIXATION, FRACTURE, INTERTROCHANTERIC, WITH INTRAMEDULLARY ROD (Left) Patient reports pain as mild.   Patient is well, and has had no acute complaints or problems PT and care management to assist with discharge planning, SNF vs. HHPT Negative for chest pain and shortness of breath Fever: no Gastrointestinal:Negative for nausea and vomiting Reports she is passing some gas this morning.  Objective: Vital signs in last 24 hours: Temp:  [97.4 F (36.3 C)-98.1 F (36.7 C)] 98 F (36.7 C) (07/10 0247) Pulse Rate:  [58-69] 58 (07/10 0247) Resp:  [16-18] 17 (07/10 0247) BP: (104-132)/(51-66) 104/51 (07/10 0247) SpO2:  [98 %-100 %] 98 % (07/10 0247)  Intake/Output from previous day:  Intake/Output Summary (Last 24 hours) at 01/19/2024 0718 Last data filed at 01/18/2024 1429 Gross per 24 hour  Intake 480 ml  Output --  Net 480 ml    Intake/Output this shift: No intake/output data recorded.  Labs: Recent Labs    01/17/24 0354 01/18/24 0324  HGB 12.7 10.6*   Recent Labs    01/17/24 0354 01/18/24 0324  WBC 15.9* 15.9*  RBC 4.46 3.79*  HCT 38.9 33.0*  PLT 222 176   Recent Labs    01/17/24 0354 01/18/24 0324  NA 136 135  K 4.4 4.6  CL 105 103  CO2 24 25  BUN 17 14  CREATININE 0.51 0.52  GLUCOSE 123* 142*  CALCIUM 9.3 8.8*   No results for input(s): LABPT, INR in the last 72 hours.   EXAM General - Patient is Alert, Appropriate, and Oriented Extremity - ABD soft Neurovascular intact Dorsiflexion/Plantar flexion intact Incision: dressing C/D/I No cellulitis present Compartment soft Dressing/Incision - clean, dry, no drainage noted to the left leg honeycomb dressings. Motor Function - intact, moving foot and toes well on exam.  Ambulated 5 feet. Abdomen soft with intact bowel sounds.  Past Medical History:  Diagnosis Date   GERD (gastroesophageal reflux disease)    HLD (hyperlipidemia)    Hypertension      Assessment/Plan: 2 Days Post-Op Procedure(s) (LRB): FIXATION, FRACTURE, INTERTROCHANTERIC, WITH INTRAMEDULLARY ROD (Left) Principal Problem:   Closed fracture of femur, intertrochanteric, left, initial encounter (HCC) Active Problems:   HTN (hypertension)   HLD (hyperlipidemia)   Fall   GERD (gastroesophageal reflux disease)   Leukocytosis   Depression   OAB (overactive bladder)  Estimated body mass index is 29.35 kg/m as calculated from the following:   Height as of this encounter: 5' 7 (1.702 m).   Weight as of 10/04/23: 85 kg. Advance diet Up with therapy D/C IV fluids when tolerating po intake.  Labs and vitals reviewed, Hg 10.6, WBC 15.9. Up with therapy today. Continue to work on a BM. Care management to assist with discharge planning.  DVT Prophylaxis - Lovenox  and TED hose Weight-Bearing as tolerated to left leg  Krystal Doyne, PA-C [facility] 01/19/2024, 7:18 AM

## 2024-01-19 NOTE — Discharge Instructions (Addendum)
 INSTRUCTIONS AFTER Surgery  Remove items at home which could result in a fall. This includes throw rugs or furniture in walking pathways ICE to the affected joint every three hours while awake for 30 minutes at a time, for at least the first 3-5 days, and then as needed for pain and swelling.  Continue to use ice for pain and swelling. You may notice swelling that will progress down to the foot and ankle.  This is normal after surgery.  Elevate your leg when you are not up walking on it.   Continue to use the breathing machine you got in the hospital (incentive spirometer) which will help keep your temperature down.  It is common for your temperature to cycle up and down following surgery, especially at night when you are not up moving around and exerting yourself.  The breathing machine keeps your lungs expanded and your temperature down.   DIET:  As you were doing prior to hospitalization, we recommend a well-balanced diet.  DRESSING / WOUND CARE / SHOWERING  Dressing change as needed.  No showering.  Patient will follow-up with [facility] orthopedics in 6 weeks for x-rays.  Staple removal by SNF on 01/31/24.  ACTIVITY  Increase activity slowly as tolerated, but follow the weight bearing instructions below.   No driving for 6 weeks or until further direction given by your physician.  You cannot drive while taking narcotics.  No lifting or carrying greater than 10 lbs. until further directed by your surgeon. Avoid periods of inactivity such as sitting longer than an hour when not asleep. This helps prevent blood clots.  You may return to work once you are authorized by your doctor.     WEIGHT BEARING  Weightbearing as tolerated on the left   EXERCISES Gait training and ambulation training with a walker.  CONSTIPATION  Constipation is defined medically as fewer than three stools per week and severe constipation as less than one stool per week.  Even if you have a regular bowel  pattern at home, your normal regimen is likely to be disrupted due to multiple reasons following surgery.  Combination of anesthesia, postoperative narcotics, change in appetite and fluid intake all can affect your bowels.   YOU MUST use at least one of the following options; they are listed in order of increasing strength to get the job done.  They are all available over the counter, and you may need to use some, POSSIBLY even all of these options:    Drink plenty of fluids (prune juice may be helpful) and high fiber foods Colace 100 mg by mouth twice a day  Senokot for constipation as directed and as needed Dulcolax (bisacodyl ), take with full glass of water  Miralax  (polyethylene glycol) once or twice a day as needed.  If you have tried all these things and are unable to have a bowel movement in the first 3-4 days after surgery call either your surgeon or your primary doctor.    If you experience loose stools or diarrhea, hold the medications until you stool forms back up.  If your symptoms do not get better within 1 week or if they get worse, check with your doctor.  If you experience the worst abdominal pain ever or develop nausea or vomiting, please contact the office immediately for further recommendations for treatment.   ITCHING:  If you experience itching with your medications, try taking only a single pain pill, or even half a pain pill at a time.  You can also use Benadryl  over the counter for itching or also to help with sleep.   TED HOSE STOCKINGS:  Use stockings on both legs until for at least 2 weeks or as directed by physician office. They may be removed at night for sleeping.  MEDICATIONS:  See your medication summary on the "After Visit Summary" that nursing will review with you.  You may have some home medications which will be placed on hold until you complete the course of blood thinner medication.  It is important for you to complete the blood thinner medication as  prescribed.  PRECAUTIONS:  If you experience chest pain or shortness of breath - call 911 immediately for transfer to the hospital emergency department.   If you develop a fever greater that 101 F, purulent drainage from wound, increased redness or drainage from wound, foul odor from the wound/dressing, or calf pain - CONTACT YOUR SURGEON.                                                   FOLLOW-UP APPOINTMENTS:  If you do not already have a post-op appointment, please call the office for an appointment to be seen by your surgeon.  Guidelines for how soon to be seen are listed in your "After Visit Summary", but are typically between 1-4 weeks after surgery.  OTHER INSTRUCTIONS:     MAKE SURE YOU:  Understand these instructions.  Get help right away if you are not doing well or get worse.    Thank you for letting us  be a part of your medical care team.  It is a privilege we respect greatly.  We hope these instructions will help you stay on track for a fast and full recovery!

## 2024-01-20 DIAGNOSIS — S72142A Displaced intertrochanteric fracture of left femur, initial encounter for closed fracture: Secondary | ICD-10-CM | POA: Diagnosis not present

## 2024-01-20 LAB — CBC
HCT: 27.8 % — ABNORMAL LOW (ref 36.0–46.0)
Hemoglobin: 9 g/dL — ABNORMAL LOW (ref 12.0–15.0)
MCH: 27.8 pg (ref 26.0–34.0)
MCHC: 32.4 g/dL (ref 30.0–36.0)
MCV: 85.8 fL (ref 80.0–100.0)
Platelets: 181 K/uL (ref 150–400)
RBC: 3.24 MIL/uL — ABNORMAL LOW (ref 3.87–5.11)
RDW: 12.6 % (ref 11.5–15.5)
WBC: 11.9 K/uL — ABNORMAL HIGH (ref 4.0–10.5)
nRBC: 0 % (ref 0.0–0.2)

## 2024-01-20 NOTE — TOC Transition Note (Signed)
 Transition of Care [facility]) - Discharge Note   Patient Details  Name: Sarah Higgins MRN: [id number] Date of Birth: 1935/05/26  [facility] CM/SW Contact:  Racheal LITTIE Schimke, RN Phone Number: 01/20/2024, 4:15 PM   Clinical Narrative: Patient to discharge home, spoke with Husband about transportation, he says he drives everyday and she rides in the truck with him. He will transport her home and Son will assist with getting her into the house.      Final next level of care: Home w Home Health Services Barriers to Discharge: Barriers Resolved   Patient Goals and CMS Choice Patient states their goals for this hospitalization and ongoing recovery are:: Return honme with Home Health.          Discharge Placement                Patient to be transferred to facility by: Husbans says he can transport. Name of family member notified: Husband Patient and family notified of of transfer: 01/20/24  Discharge Plan and Services Additional resources added to the After Visit Summary for                  DME Arranged: N/A DME Agency: NA       HH Arranged: PT, OT HH Agency: [facility] Date HH Agency Contacted: 01/19/24      Social Drivers of Health (SDOH) Interventions SDOH Screenings   Food Insecurity: No Food Insecurity (06/15/2020)   Received [facility]  Housing: Unknown (10/04/2023)   Received from [facility]  Transportation Needs: No Transportation Needs (10/23/2018)   Received from [facility]  Financial Resource Strain: Low Risk  (10/23/2018)   Received from [facility]  Physical Activity: Inactive (10/23/2018)   Received from [facility]  Social Connections: Unknown (01/17/2024)  Stress: No Stress Concern Present (10/23/2018)   Received from [facility]  Tobacco Use: Low Risk  (01/17/2024)  Health Literacy: Low Risk  (10/17/2020)   Received from [facility]     Readmission Risk  Interventions     No data to display

## 2024-01-20 NOTE — Progress Notes (Signed)
  Subjective: 3 Days Post-Op Procedure(s) (LRB): FIXATION, FRACTURE, INTERTROCHANTERIC, WITH INTRAMEDULLARY ROD (Left) Patient reports pain as mild in the left hip. Patient with a cough this AM. Patient is well, and has had no acute complaints or problems Plan for discharge to SNF. Negative for chest pain and shortness of breath Fever: no Gastrointestinal:Negative for nausea and vomiting Reports she is passing some gas this morning.  Has not had a BM yet.  Objective: Vital signs in last 24 hours: Temp:  [97.5 F (36.4 C)-98 F (36.7 C)] 97.6 F (36.4 C) (07/11 0330) Pulse Rate:  [58-78] 63 (07/11 0330) Resp:  [16-20] 20 (07/11 0330) BP: (97-128)/(53-71) 122/61 (07/11 0330) SpO2:  [94 %-99 %] 96 % (07/11 0330)  Intake/Output from previous day:  Intake/Output Summary (Last 24 hours) at 01/20/2024 0735 Last data filed at 01/19/2024 1523 Gross per 24 hour  Intake --  Output 800 ml  Net -800 ml    Intake/Output this shift: No intake/output data recorded.  Labs: Recent Labs    01/18/24 0324  HGB 10.6*   Recent Labs    01/18/24 0324  WBC 15.9*  RBC 3.79*  HCT 33.0*  PLT 176   Recent Labs    01/18/24 0324  NA 135  K 4.6  CL 103  CO2 25  BUN 14  CREATININE 0.52  GLUCOSE 142*  CALCIUM 8.8*   No results for input(s): LABPT, INR in the last 72 hours.   EXAM General - Patient is Alert, Appropriate, and Oriented Extremity - ABD soft Neurovascular intact Dorsiflexion/Plantar flexion intact Incision: dressing C/D/I No cellulitis present Compartment soft Dressing/Incision - clean, dry, no drainage noted to the left leg honeycomb dressings. Motor Function - intact, moving foot and toes well on exam.   Abdomen soft with intact bowel sounds.  Past Medical History:  Diagnosis Date   GERD (gastroesophageal reflux disease)    HLD (hyperlipidemia)    Hypertension     Assessment/Plan: 3 Days Post-Op Procedure(s) (LRB): FIXATION, FRACTURE,  INTERTROCHANTERIC, WITH INTRAMEDULLARY ROD (Left) Principal Problem:   Closed fracture of femur, intertrochanteric, left, initial encounter (HCC) Active Problems:   HTN (hypertension)   HLD (hyperlipidemia)   Fall   GERD (gastroesophageal reflux disease)   Leukocytosis   Depression   OAB (overactive bladder)  Estimated body mass index is 29.35 kg/m as calculated from the following:   Height as of this encounter: 5' 7 (1.702 m).   Weight as of 10/04/23: 85 kg. Advance diet Up with therapy D/C IV fluids when tolerating po intake.  Vitals reviewed this AM.  CBC ordered. Patient with increased cough this AM, encouraged incentive spirometer.  Chest x-ray if no improvement. Up with therapy today. Continue to work on a BM. Care management to assist with discharge planning.  Following discharge, continue Lovenox  for 14 days for DVT prophylaxis. Staples can be removed by SNF on 01/31/24.  Follow-up with [facility] in 6 weeks for x-ray of the left femur.  DVT Prophylaxis - Lovenox  and TED hose Weight-Bearing as tolerated to left leg  J. Gustavo Level, PA-C [facility] 01/20/2024, 7:35 AM

## 2024-01-20 NOTE — Progress Notes (Signed)
 Occupational Therapy Treatment Patient Details Name: Sarah Higgins MRN: [id number] DOB: 1935/05/29 Today's Date: 01/20/2024   History of present illness Pt is an 89 year old female s/p fall resulting in closed, displaced intertrochanteric L hip fx s/p intramedullary nailing on 7/8.  PMH of GERD, HTN, HLD, OAB, neuropathy, depression   OT comments  Pt is supine in bed on arrival. Easily arousable and agreeable to OT session. She continues to have LLE pain with guarding and hesitancy to bear weight through LLE causing R lateral lean with sitting/standing. Pt performed bed mobility to exit R side of bed with Min A for LLE management and use of bed features and cueing. Pt required Min A progressing to CGA for seated balance this date with improvement in upright posture. Mod A x2 and Max A X1 for 2 separate standing trials from lowest bed height with cues for hand/feet placement and anterior weight shift. Increased R posteriolateral lean with 1 person assist. Pt took a few steps to sink to perform oral care, but began vomiting and returned to EOB. Nurse notified and to bring nausea meds. Provided emesis bag and gingerale. Pt agreeable to step pivot to recliner requiring Min A x1 for assist with RW management and sequencing at times, SBA provided from PT to ensure safe descent into recliner.  Pt left seated in recliner with all needs in place and will cont to require skilled acute OT services to maximize her safety and IND to return to PLOF.       If plan is discharge home, recommend the following:  A lot of help with walking and/or transfers;A little help with walking and/or transfers;A lot of help with bathing/dressing/bathroom   Equipment Recommendations  BSC/3in1;Wheelchair (measurements OT);Hospital bed    Recommendations for Other Services      Precautions / Restrictions Precautions Precautions: Fall Recall of Precautions/Restrictions: Impaired Restrictions Weight Bearing Restrictions Per  Provider Order: Yes LLE Weight Bearing Per Provider Order: Weight bearing as tolerated       Mobility Bed Mobility Overal bed mobility: Needs Assistance Bed Mobility: Supine to Sit     Supine to sit: Min assist, HOB elevated, Used rails     General bed mobility comments: pt exited to R side of bed this date with noted improvement using bed features and cueing, LLE management to reach EOB    Transfers Overall transfer level: Needs assistance Equipment used: Rolling walker (2 wheels) Transfers: Sit to/from Stand Sit to Stand: Max assist, Mod assist, +2 physical assistance     Step pivot transfers: Min assist, +2 safety/equipment     General transfer comment: Initially Mod A x2 to stand from EOB to RW with cueing for hand/feet placement and pushing up from EOB and for anterior weight shift with improvement; attempting standing at sink but began vomiting and took a few steps back to bed; STS with 1 person assist requiring Max A and increased R posteriolateral lean with cueing to weight shift to her L, able to step pivot to recliner with Min A  and RW use , 2nd assist SBA for safety with descent to recliner     Balance Overall balance assessment: Needs assistance Sitting-balance support: Feet supported, Bilateral upper extremity supported Sitting balance-Leahy Scale: Poor Sitting balance - Comments: R lateral and posterior lean at times, but able to progress to CGA this date with BUE support Postural control: Posterior lean, Right lateral lean Standing balance support: Reliant on assistive device for balance, Bilateral upper extremity supported Standing  balance-Leahy Scale: Poor Standing balance comment: heavy BUE support on RW/counter                           ADL either performed or assessed with clinical judgement   ADL Overall ADL's : Needs assistance/impaired       Grooming Details (indicate cue type and reason): attempted standing at sink for oral care  however pt began vomiting prior to oral care and returned to sitting EOB         Upper Body Dressing : Sitting;Supervision/safety Upper Body Dressing Details (indicate cue type and reason): supervision and cueing to don gown seated at [facility]                        Extremity/Trunk Assessment              Vision       Perception     Praxis     Communication Communication Communication: No apparent difficulties   Cognition Arousal: Alert Behavior During Therapy: WFL for tasks assessed/performed                                 Following commands: Intact        Cueing   Cueing Techniques: Verbal cues  Exercises      Shoulder Instructions       General Comments nausea with vomiting this date    Pertinent Vitals/ Pain       Pain Assessment Pain Assessment: Faces Faces Pain Scale: Hurts little more Pain Location: L hip - increases with any hip movement Pain Descriptors / Indicators: Tender, Sore, Discomfort, Guarding Pain Intervention(s): Monitored during session, Repositioned  Home Living                                          Prior Functioning/Environment              Frequency  Min 2X/week        Progress Toward Goals  OT Goals(current goals can now be found in the care plan section)  Progress towards OT goals: Progressing toward goals  Acute Rehab OT Goals Patient Stated Goal: go home OT Goal Formulation: With patient Time For Goal Achievement: 02/01/24 Potential to Achieve Goals: Fair  Plan      Co-evaluation    PT/OT/SLP Co-Evaluation/Treatment: Yes Reason for Co-Treatment: Necessary to address cognition/behavior during functional activity;For patient/therapist safety;To address functional/ADL transfers PT goals addressed during session: Mobility/safety with mobility OT goals addressed during session: ADL's and self-care      AM-PAC OT 6 Clicks Daily Activity     Outcome Measure    Help from another person eating meals?: None Help from another person taking care of personal grooming?: A Little Help from another person toileting, which includes using toliet, bedpan, or urinal?: A Lot Help from another person bathing (including washing, rinsing, drying)?: A Lot Help from another person to put on and taking off regular upper body clothing?: A Little Help from another person to put on and taking off regular lower body clothing?: A Lot 6 Click Score: 16    End of Session Equipment Utilized During Treatment: Gait belt;Rolling walker (2 wheels)  OT Visit Diagnosis: Other abnormalities of gait and mobility (R26.89);Muscle weakness (generalized) (  M62.81);Unsteadiness on feet (R26.81);Pain Pain - Right/Left: Left Pain - part of body: Leg   Activity Tolerance Patient tolerated treatment well;Patient limited by pain   Patient Left in chair;with call bell/phone within reach;with chair alarm set;with family/visitor present   Nurse Communication Mobility status        Time: 1130-1155 OT Time Calculation (min): 25 min  Charges: OT General Charges $OT Visit: 1 Visit OT Treatments $Therapeutic Activity: 8-22 mins  Bren Borys, OTR/L  01/20/24, 1:26 PM   Shanika Levings E Eliyohu Class 01/20/2024, 1:22 PM

## 2024-01-20 NOTE — Progress Notes (Signed)
 Reviewed discharge note with patient. Patient acknowledged understanding. Patient discharged with personal belongings. Patient wheeled out by staff. Patient transported home via taxi cab.

## 2024-01-20 NOTE — Plan of Care (Signed)
   Problem: Education: Goal: Knowledge of General Education information will improve Description: Including pain rating scale, medication(s)/side effects and non-pharmacologic comfort measures Outcome: Progressing   Problem: Activity: Goal: Risk for activity intolerance will decrease Outcome: Progressing   Problem: Nutrition: Goal: Adequate nutrition will be maintained Outcome: Progressing

## 2024-01-20 NOTE — Discharge Summary (Signed)
 Physician Discharge Summary   Patient: Sarah Higgins MRN: [id number]  DOB: 1935-04-10   Admit:     Date of Admission: 01/17/2024 Admitted from: home   Discharge: Date of discharge: 01/20/24 Disposition: Home health Condition at discharge: good  CODE STATUS: FULL CODE     Discharge Physician: Laneta Blunt, DO [facility]     PCP: System, Provider Not In  Recommendations for Outpatient Follow-up:  Follow up with PCP System, Provider Not In in 2-4 weeks Follow as directed with orthopedics  Staples can be removed on 01/31/24 by ortho or home health team        Discharge Diagnoses: Principal Problem:   Closed fracture of femur, intertrochanteric, left, initial encounter (HCC) Active Problems:   HTN (hypertension)   HLD (hyperlipidemia)   Fall   GERD (gastroesophageal reflux disease)   Leukocytosis   Depression   OAB (overactive bladder)       Hospital course / significant events:   HPI: Patient with PMH of GERD, HTN, HLD, OAB, neuropathy who presented to the hospital with fall.Patient is from home.  Walks with a walker. While trying to sit down she missed a chair and fell on the left side. To ED w/ L hip pain  07/08: to ED, admitted for L hip fracture. Underwent repair w/ ortho.  07/09-07/10: PT/OT recs for SNF, pt prefers for home health and has family support, son will be coming to help. HH/DME in process, son arriving later 07/10 07/11: stable for discharge, family to transport    Consultants:  Orthopedics  Procedures/Surgeries: Repair L hip fracture     ASSESSMENT & PLAN:   Closed, displaced, intertrochanteric left hip fracture. Mechanical fall. S/p intramedullary nailing on 7/8. Pain control WBAT  PT/OT - requiring significant assistance, son to come this evening appreciate if PT/OT can demonstrate needs  Lovenox , TED hose On discharge, continue Lovenox  for 14 days  Staples can be removed on 01/31/24.   Follow-up with [facility] in 6 weeks for x-ray of the left femur.  Depression. Continue home regimen per med rec  Overactive bladder. Continue home regimen per med rec  HLD. Continue statin.  GERD. Continue PPI and famotidine   Leukocytosis. Secondary to stress reaction. Monitor CBC     Overweight/Class 1 obesity based on BMI: Body mass index is 29.35 kg/m.SABRA Significantly low or high BMI is associated with higher medical risk.  Underweight - under 18  overweight - 25 to 29 obese - 30 or more Class 1 obesity: BMI of 30.0 to 34 Class 2 obesity: BMI of 35.0 to 39 Class 3 obesity: BMI of 40.0 to 49 Super Morbid Obesity: BMI 50-59 Super-super Morbid Obesity: BMI 60+ Healthy nutrition and physical activity advised as adjunct to other disease management and risk reduction treatments          Discharge Instructions  Allergies as of 01/20/2024   No Known Allergies      Medication List     STOP taking these medications    omeprazole 40 MG capsule Commonly known as: PRILOSEC       TAKE these medications    acetaminophen  325 MG tablet Commonly known as: TYLENOL  Take 2 tablets (650 mg total) by mouth every 6 (six) hours as needed for fever, headache or moderate pain.   albuterol  108 (90 Base) MCG/ACT inhaler Commonly known as: VENTOLIN  HFA Inhale 2 puffs into the lungs 2 (two) times daily.   alendronate  70 MG tablet Commonly known as: FOSAMAX   Take 1 tablet (70 mg total) by mouth every 7 (seven) days.   ascorbic acid  500 MG tablet Commonly known as: VITAMIN C Take 1 tablet (500 mg total) by mouth daily.   benzonatate  100 MG capsule Commonly known as: TESSALON  Take 1 capsule (100 mg total) by mouth 3 (three) times daily as needed for cough. What changed: Another medication with the same name was removed. Continue taking this medication, and follow the directions you see here.   buPROPion  150 MG 24 hr tablet Commonly known as: WELLBUTRIN  XL Take 1 tablet (150 mg  total) by mouth every morning.   carbamide peroxide 6.5 % OTIC solution Commonly known as: DEBROX 5 drops. Administer 5 drops into the left ear two (2) times a day.   enoxaparin  30 MG/0.3ML injection Commonly known as: LOVENOX  Inject 0.3 mLs (30 mg total) into the skin daily for 14 days.   esomeprazole  40 MG capsule Commonly known as: NEXIUM  Take 1 capsule (40 mg total) by mouth daily before dinner.   famotidine  40 MG tablet Commonly known as: PEPCID  Take 1 tablet (40 mg total) by mouth nightly as needed for heartburn.   FLUoxetine  40 MG capsule Commonly known as: PROZAC  Take 1 capsule (40 mg total) by mouth daily.   fluticasone  50 MCG/ACT nasal spray Commonly known as: FLONASE  Place 1 spray into both nostrils daily. What changed: Another medication with the same name was removed. Continue taking this medication, and follow the directions you see here.   gabapentin  100 MG capsule Commonly known as: NEURONTIN  Take 1 capsule (100 mg total) by mouth 2 (two) times daily. What changed: Another medication with the same name was removed. Continue taking this medication, and follow the directions you see here.   gabapentin  300 MG capsule Commonly known as: NEURONTIN  Take 1 capsule (300 mg total) by mouth at bedtime. What changed: Another medication with the same name was removed. Continue taking this medication, and follow the directions you see here.   HYDROcodone -acetaminophen  5-325 MG tablet Commonly known as: NORCO/VICODIN Take 1-2 tablets by mouth every 4 (four) hours as needed for moderate pain (pain score 4-6) or severe pain (pain score 7-10).   latanoprost  0.005 % ophthalmic solution Commonly known as: XALATAN  Place 1 drop into both eyes at bedtime.   levocetirizine 5 MG tablet Commonly known as: XYZAL  Take 1 tablet (5 mg total) by mouth at bedtime. What changed: Another medication with the same name was removed. Continue taking this medication, and follow the  directions you see here.   loperamide  2 MG capsule Commonly known as: IMODIUM  Take 4 mg by mouth 2 (two) times daily as needed for diarrhea or loose stools.   lovastatin  20 MG tablet Commonly known as: MEVACOR  Take 1 tablet (20 mg total) by mouth daily with evening meal. What changed: Another medication with the same name was removed. Continue taking this medication, and follow the directions you see here.   oxybutynin  10 MG 24 hr tablet Commonly known as: DITROPAN -XL Take 10 mg by mouth daily. What changed: Another medication with the same name was removed. Continue taking this medication, and follow the directions you see here.   polyethylene glycol 17 g packet Commonly known as: MIRALAX  / GLYCOLAX  Take 17 g by mouth daily as needed for mild constipation.   senna-docusate 8.6-50 MG tablet Commonly known as: Senokot-S Take 1 tablet by mouth at bedtime as needed for mild constipation.   sodium chloride  HYPERTONIC 3 % nebulizer solution Take 4 mLs by  nebulization 2 (two) times daily.   venlafaxine  XR 150 MG 24 hr capsule Commonly known as: EFFEXOR -XR Take 150 mg by mouth daily.               Durable Medical Equipment  (From admission, onward)           Start     Ordered   01/19/24 1253  DME Walker rolling  (Discharge Planning)  Once       Question Answer Comment  Walker: With 5 Inch Wheels   Patient needs a walker to treat with the following condition Femur fracture (HCC)      01/19/24 1253   01/19/24 1253  DME 3-in-1  (Discharge Planning)  Once        01/19/24 1253   01/19/24 1253  For home use only DME Hospital bed  Once       Question Answer Comment  Length of Need 6 Months   Head must be elevated greater than: 30 degrees   Bed type Semi-electric      01/19/24 1253             Follow-up Information     Kip Lynwood Double, PA-C Follow up in 6 week(s).   Specialty: Physician Assistant Why: x-rays of the left femur.  Staples can be removed by  SNF on 01/31/24 Contact information: [address] [place] [address] [phone]                 No Known Allergies   Subjective: pt reports feeling well this morning, pain is controlled, no other concerns   Discharge Exam: BP 110/62   Pulse 71   Temp 98.7 F (37.1 C)   Resp 17   Ht 5' 7 (1.702 m)   SpO2 100%   BMI 29.35 kg/m  General: Pt is alert, awake, not in acute distress Cardiovascular: RRR, S1/S2 +murmur, no rubs, no gallops Respiratory: CTA bilaterally, no wheezing, no rhonchi Abdominal: Soft, NT, ND, bowel sounds + Extremities: no edema, no cyanosis     The results of significant diagnostics from this hospitalization (including imaging, microbiology, ancillary and laboratory) are listed below for reference.     Microbiology: No results found for this or any previous visit (from the past 240 hours).   Labs: BNP (last 3 results) No results for input(s): BNP in the last 8760 hours. Basic Metabolic Panel: Recent Labs  Lab 01/17/24 0354 01/18/24 0324  NA 136 135  K 4.4 4.6  CL 105 103  CO2 24 25  GLUCOSE 123* 142*  BUN 17 14  CREATININE 0.51 0.52  CALCIUM 9.3 8.8*   Liver Function Tests: No results for input(s): AST, ALT, ALKPHOS, BILITOT, PROT, ALBUMIN in the last 168 hours. No results for input(s): LIPASE, AMYLASE in the last 168 hours. No results for input(s): AMMONIA in the last 168 hours. CBC: Recent Labs  Lab 01/17/24 0354 01/18/24 0324 01/20/24 0801  WBC 15.9* 15.9* 11.9*  NEUTROABS 10.1*  --   --   HGB 12.7 10.6* 9.0*  HCT 38.9 33.0* 27.8*  MCV 87.2 87.1 85.8  PLT 222 176 181   Cardiac Enzymes: No results for input(s): CKTOTAL, CKMB, CKMBINDEX, TROPONINI in the last 168 hours. BNP: Invalid input(s): POCBNP CBG: No results for input(s): GLUCAP in the last 168 hours. D-Dimer No results for input(s): DDIMER in the last 72 hours. Hgb A1c No results for input(s): HGBA1C in  the last 72 hours. Lipid Profile No results for input(s): CHOL,  HDL, LDLCALC, TRIG, CHOLHDL, LDLDIRECT in the last 72 hours. Thyroid function studies No results for input(s): TSH, T4TOTAL, T3FREE, THYROIDAB in the last 72 hours.  Invalid input(s): FREET3 Anemia work up No results for input(s): VITAMINB12, FOLATE, FERRITIN, TIBC, IRON, RETICCTPCT in the last 72 hours. Urinalysis No results found for: COLORURINE, APPEARANCEUR, LABSPEC, PHURINE, GLUCOSEU, HGBUR, BILIRUBINUR, KETONESUR, PROTEINUR, UROBILINOGEN, NITRITE, LEUKOCYTESUR Sepsis Labs Recent Labs  Lab 01/17/24 0354 01/18/24 0324 01/20/24 0801  WBC 15.9* 15.9* 11.9*   Microbiology No results found for this or any previous visit (from the past 240 hours). Imaging DG HIP UNILAT WITH PELVIS 2-3 VIEWS LEFT Result Date: 01/17/2024 CLINICAL DATA:  Surgical internal fixation of left hip fracture. EXAM: DG HIP (WITH OR WITHOUT PELVIS) 2-3V LEFT; DG C-ARM 1-60 MIN-NO REPORT Radiation exposure index: 2.4207 mGy. COMPARISON:  Same day. FINDINGS: Four intraoperative fluoroscopic images were obtained of the left hip and femur. These images demonstrate surgical internal fixation of proximal left femoral intertrochanteric fracture with intramedullary rod fixation of left femur. IMPRESSION: Fluoroscopic guidance provided during surgical internal fixation of proximal left femoral fracture Electronically Signed   By: Lynwood Landy Raddle M.D.   On: 01/17/2024 17:26   DG C-Arm 1-60 Min-No Report Result Date: 01/17/2024 Fluoroscopy was utilized by the requesting physician.  No radiographic interpretation.   DG Chest 1 View Result Date: 01/17/2024 CLINICAL DATA:  Hip fracture.  Preoperative respiratory evaluation. EXAM: CHEST  1 VIEW COMPARISON:  11/30/2020 FINDINGS: Chronic atelectasis or scarring noted in the parahilar right lung, similar to prior. Ill-defined airspace opacity in the right upper lung  is also similar. Left lung remains clear. Cardiopericardial silhouette is at upper limits of normal for size. Small hiatal hernia evident. Bones are diffusely demineralized. Telemetry leads overlie the chest. IMPRESSION: 1. Chronic atelectasis or scarring in the parahilar right lung with ill-defined airspace opacity in the right upper lung. No significant change from prior chest x-ray. Remote chest CT from 20/12 showed prominent tree-in-bud nodularity with airway impaction in the right upper and lower lobes compatible with bronchopneumonia/atypical infection. 2. Small hiatal hernia. Electronically Signed   By: Camellia Candle M.D.   On: 01/17/2024 06:12   DG Hip Unilat W or Wo Pelvis 2-3 Views Left Result Date: 01/17/2024 CLINICAL DATA:  Fall.  Severe left hip pain. EXAM: DG HIP (WITH OR WITHOUT PELVIS) 2-3V LEFT COMPARISON:  12/01/2020 FINDINGS: Bones are diffusely demineralized. SI joints and symphysis pubis unremarkable. No evidence for pubic ramus fracture. AP and frog-leg lateral views of the left hip show a intertrochanteric comminuted left proximal femur fracture with varus angulation. IMPRESSION: Intertrochanteric comminuted left proximal femur fracture with varus angulation. Electronically Signed   By: Camellia Candle M.D.   On: 01/17/2024 06:10      Time coordinating discharge: over 30 minutes  SIGNED:  Norena Bratton DO [facility]

## 2024-01-20 NOTE — Plan of Care (Signed)
  Problem: Clinical Measurements: Goal: Will remain free from infection Outcome: Progressing Goal: Diagnostic test results will improve Outcome: Progressing   Problem: Self-Concept: Goal: Ability to maintain and perform role responsibilities to the fullest extent possible will improve Outcome: Progressing   Problem: Pain Management: Goal: Pain level will decrease Outcome: Progressing

## 2024-01-20 NOTE — Progress Notes (Signed)
 Physical Therapy Treatment Patient Details Name: Sarah Higgins MRN: [id number] DOB: 04-30-1935 Today's Date: 01/20/2024   History of Present Illness Pt is an 89 year old female s/p fall resulting in closed, displaced intertrochanteric L hip fx s/p intramedullary nailing on 7/8.  PMH of GERD, HTN, HLD, OAB, neuropathy, depression    PT Comments  Pt received upright in bed agreeable to PT/OT co-treat in attempts to progress functional mobility. Pt with improved bed mobility exiting R side of bed needing minA for LLE but is reliant on increased time, VC's and bed features to complete. Also needs minA on chuck pad to scoot anteriorly. X2 STS performed at [facility] with modA+2 to RW with onset of N/V during standing into sink. Pt remains with grossly R lateral and posterior lean throughout sitting and into standing. Pt reports need for seated rest on EOB for more emesis. Relies on MaxA+1 for STS and minA on RW for SPT to recliner. Pt and rehab in agreement to limit further mobility due to acute N/V. RN notified with pt requesting anti emetic meds. Pt left in care of spouse with all needs in reach. Emesis bag provided. D/c recs remain appropriate.     If plan is discharge home, recommend the following: Two people to help with walking and/or transfers;A lot of help with bathing/dressing/bathroom;Assistance with cooking/housework;Help with stairs or ramp for entrance;Assist for transportation   Can travel by private vehicle     No  Equipment Recommendations  Rolling walker (2 wheels);BSC/3in1;Hospital bed    Recommendations for Other Services       Precautions / Restrictions Precautions Precautions: Fall Recall of Precautions/Restrictions: Impaired Restrictions Weight Bearing Restrictions Per Provider Order: Yes LLE Weight Bearing Per Provider Order: Weight bearing as tolerated     Mobility  Bed Mobility Overal bed mobility: Needs Assistance Bed Mobility: Supine to Sit     Supine to sit: Min  assist, HOB elevated, Used rails (exiting to the R)       Patient Response: Cooperative  Transfers Overall transfer level: Needs assistance Equipment used: Rolling walker (2 wheels) Transfers: Sit to/from Stand Sit to Stand: Max assist, Mod assist, +2 physical assistance           General transfer comment: ModA+2 o stand with reduced post bias. MaxA+1 to stand single person with heavy posterior bias and poor hand placement.    Ambulation/Gait               General Gait Details: deferred due to emesis   Stairs             Wheelchair Mobility     Tilt Bed Tilt Bed Patient Response: Cooperative  Modified Rankin (Stroke Patients Only)       Balance Overall balance assessment: Needs assistance Sitting-balance support: Feet supported, Bilateral upper extremity supported Sitting balance-Leahy Scale: Poor Sitting balance - Comments: R lateral and posterior lean Postural control: Posterior lean, Right lateral lean Standing balance support: Reliant on assistive device for balance, Bilateral upper extremity supported Standing balance-Leahy Scale: Poor Standing balance comment: heavy BUE support on surfaces                            Communication Communication Communication: No apparent difficulties  Cognition Arousal: Alert Behavior During Therapy: WFL for tasks assessed/performed   PT - Cognitive impairments: No apparent impairments  Following commands: Intact      Cueing Cueing Techniques: Verbal cues  Exercises      General Comments        Pertinent Vitals/Pain Pain Assessment Pain Assessment: Faces Faces Pain Scale: Hurts little more Pain Location: L hip - increases with any hip movement Pain Descriptors / Indicators: Tender, Sore, Discomfort, Guarding Pain Intervention(s): Limited activity within patient's tolerance, Monitored during session, Repositioned    Home Living                           Prior Function            PT Goals (current goals can now be found in the care plan section) Acute Rehab PT Goals Patient Stated Goal: get walking again, get home PT Goal Formulation: With patient Time For Goal Achievement: 01/31/24 Potential to Achieve Goals: Fair Progress towards PT goals: Progressing toward goals    Frequency    7X/week      PT Plan      Co-evaluation PT/OT/SLP Co-Evaluation/Treatment: Yes Reason for Co-Treatment: Necessary to address cognition/behavior during functional activity;For patient/therapist safety PT goals addressed during session: Mobility/safety with mobility OT goals addressed during session: ADL's and self-care      AM-PAC PT 6 Clicks Mobility   Outcome Measure  Help needed turning from your back to your side while in a flat bed without using bedrails?: A Lot Help needed moving from lying on your back to sitting on the side of a flat bed without using bedrails?: A Lot Help needed moving to and from a bed to a chair (including a wheelchair)?: A Lot Help needed standing up from a chair using your arms (e.g., wheelchair or bedside chair)?: A Lot Help needed to walk in hospital room?: Total Help needed climbing 3-5 steps with a railing? : Total 6 Click Score: 10    End of Session Equipment Utilized During Treatment: Gait belt Activity Tolerance: Other (comment) (N/V) Patient left: in chair;with call bell/phone within reach;with chair alarm set;with family/visitor present Nurse Communication: Mobility status PT Visit Diagnosis: Muscle weakness (generalized) (M62.81);Difficulty in walking, not elsewhere classified (R26.2);Pain Pain - Right/Left: Left Pain - part of body: Hip     Time: 1130-1155 PT Time Calculation (min) (ACUTE ONLY): 25 min  Charges:    $Therapeutic Activity: 8-22 mins PT General Charges $$ ACUTE PT VISIT: 1 Visit                     Dorina HERO. Fairly IV, PT, DPT Physical Therapist- [facility] 01/20/2024, 12:39 PM

## 2024-01-30 ENCOUNTER — Other Ambulatory Visit: Payer: Self-pay

## 2024-01-30 ENCOUNTER — Other Ambulatory Visit (HOSPITAL_COMMUNITY): Payer: Self-pay | Admitting: Orthopedic Surgery

## 2024-01-30 ENCOUNTER — Other Ambulatory Visit (HOSPITAL_COMMUNITY): Payer: Self-pay

## 2024-02-16 ENCOUNTER — Other Ambulatory Visit (HOSPITAL_COMMUNITY): Payer: Self-pay

## 2024-03-06 ENCOUNTER — Other Ambulatory Visit (HOSPITAL_COMMUNITY): Payer: Self-pay

## 2024-03-07 ENCOUNTER — Other Ambulatory Visit (HOSPITAL_COMMUNITY): Payer: Self-pay

## 2024-03-07 MED ORDER — ALBUTEROL SULFATE HFA 108 (90 BASE) MCG/ACT IN AERS
2.0000 | INHALATION_SPRAY | Freq: Two times a day (BID) | RESPIRATORY_TRACT | 2 refills | Status: AC
  Filled 2024-03-07: qty 6.7, 30d supply, fill #0

## 2024-03-07 MED ORDER — AMOXICILLIN-POT CLAVULANATE 875-125 MG PO TABS
1.0000 | ORAL_TABLET | Freq: Two times a day (BID) | ORAL | 0 refills | Status: DC
  Filled 2024-03-07: qty 14, 7d supply, fill #0

## 2024-03-07 MED ORDER — FLUTICASONE PROPIONATE 50 MCG/ACT NA SUSP
1.0000 | Freq: Every day | NASAL | 5 refills | Status: AC
  Filled 2024-03-07: qty 16, 60d supply, fill #0

## 2024-03-13 ENCOUNTER — Other Ambulatory Visit (HOSPITAL_COMMUNITY): Payer: Self-pay

## 2024-03-16 ENCOUNTER — Other Ambulatory Visit (HOSPITAL_COMMUNITY): Payer: Self-pay

## 2024-03-28 ENCOUNTER — Other Ambulatory Visit (HOSPITAL_COMMUNITY): Payer: Self-pay

## 2024-04-03 ENCOUNTER — Other Ambulatory Visit (HOSPITAL_COMMUNITY): Payer: Self-pay

## 2024-04-05 ENCOUNTER — Other Ambulatory Visit (HOSPITAL_COMMUNITY): Payer: Self-pay

## 2024-04-10 ENCOUNTER — Other Ambulatory Visit (HOSPITAL_COMMUNITY): Payer: Self-pay

## 2024-04-10 ENCOUNTER — Other Ambulatory Visit: Payer: Self-pay

## 2024-04-10 MED ORDER — MIRTAZAPINE 15 MG PO TABS
15.0000 mg | ORAL_TABLET | Freq: Every evening | ORAL | 2 refills | Status: DC
  Filled 2024-04-10: qty 30, 30d supply, fill #0
  Filled 2024-05-24 – 2024-05-28 (×2): qty 30, 30d supply, fill #1
  Filled 2024-06-22: qty 30, 30d supply, fill #2

## 2024-04-10 MED ORDER — QUAKE DEVI: 0 refills | Status: AC

## 2024-04-10 MED ORDER — FLUOXETINE HCL 20 MG PO CAPS
20.0000 mg | ORAL_CAPSULE | Freq: Every day | ORAL | 2 refills | Status: AC
  Filled 2024-04-10: qty 30, 30d supply, fill #0
  Filled 2024-04-30: qty 30, 30d supply, fill #1

## 2024-04-16 ENCOUNTER — Other Ambulatory Visit (HOSPITAL_COMMUNITY): Payer: Self-pay

## 2024-04-16 MED ORDER — LIDOCAINE 5 % EX PTCH
1.0000 | MEDICATED_PATCH | Freq: Every day | CUTANEOUS | 1 refills | Status: AC
  Filled 2024-04-16: qty 30, 30d supply, fill #0

## 2024-04-17 ENCOUNTER — Encounter (HOSPITAL_COMMUNITY): Payer: Self-pay

## 2024-04-17 ENCOUNTER — Other Ambulatory Visit (HOSPITAL_COMMUNITY): Payer: Self-pay

## 2024-04-19 ENCOUNTER — Other Ambulatory Visit (HOSPITAL_COMMUNITY): Payer: Self-pay

## 2024-04-19 ENCOUNTER — Other Ambulatory Visit: Payer: Self-pay

## 2024-04-23 ENCOUNTER — Other Ambulatory Visit: Payer: Self-pay

## 2024-04-23 ENCOUNTER — Other Ambulatory Visit (HOSPITAL_COMMUNITY): Payer: Self-pay

## 2024-04-23 MED ORDER — OXYBUTYNIN CHLORIDE ER 10 MG PO TB24
10.0000 mg | ORAL_TABLET | Freq: Every day | ORAL | 3 refills | Status: AC
  Filled 2024-04-23: qty 100, 100d supply, fill #0
  Filled 2024-05-01 – 2024-07-23 (×2): qty 100, 100d supply, fill #1

## 2024-04-30 ENCOUNTER — Other Ambulatory Visit (HOSPITAL_COMMUNITY): Payer: Self-pay

## 2024-04-30 ENCOUNTER — Other Ambulatory Visit: Payer: Self-pay

## 2024-04-30 MED ORDER — BENZONATATE 100 MG PO CAPS
100.0000 mg | ORAL_CAPSULE | Freq: Three times a day (TID) | ORAL | 3 refills | Status: AC | PRN
  Filled 2024-04-30: qty 90, 30d supply, fill #0
  Filled 2024-05-01 – 2024-06-01 (×3): qty 90, 30d supply, fill #1
  Filled 2024-07-17: qty 90, 30d supply, fill #2
  Filled 2024-08-14: qty 90, 30d supply, fill #3

## 2024-04-30 MED ORDER — FLUOXETINE HCL 20 MG PO CAPS
20.0000 mg | ORAL_CAPSULE | Freq: Every day | ORAL | 3 refills | Status: AC
  Filled 2024-04-30 – 2024-05-01 (×3): qty 90, 90d supply, fill #0
  Filled 2024-05-03 (×2): qty 30, 30d supply, fill #0
  Filled 2024-05-28: qty 30, 30d supply, fill #1
  Filled 2024-06-22: qty 30, 30d supply, fill #2
  Filled 2024-07-09 – 2024-07-23 (×2): qty 30, 30d supply, fill #3
  Filled ????-??-??: fill #0

## 2024-04-30 MED ORDER — AZITHROMYCIN 500 MG PO TABS
ORAL_TABLET | ORAL | 3 refills | Status: AC
  Filled 2024-04-30: qty 40, 90d supply, fill #0
  Filled 2024-07-26 (×2): qty 40, 90d supply, fill #1

## 2024-04-30 MED ORDER — FAMOTIDINE 40 MG PO TABS
ORAL_TABLET | ORAL | 2 refills | Status: AC
  Filled 2024-04-30 – 2024-05-01 (×2): qty 180, 90d supply, fill #0

## 2024-04-30 MED ORDER — ESOMEPRAZOLE MAGNESIUM 40 MG PO CPDR
40.0000 mg | DELAYED_RELEASE_CAPSULE | Freq: Every day | ORAL | 1 refills | Status: AC
  Filled 2024-04-30: qty 90, 90d supply, fill #0

## 2024-04-30 MED ORDER — BUPROPION HCL ER (XL) 150 MG PO TB24
150.0000 mg | ORAL_TABLET | Freq: Every morning | ORAL | 3 refills | Status: AC
  Filled 2024-04-30: qty 90, 90d supply, fill #0
  Filled 2024-06-22: qty 30, 30d supply, fill #0
  Filled 2024-07-23: qty 30, 30d supply, fill #1

## 2024-04-30 MED ORDER — ESOMEPRAZOLE MAGNESIUM 40 MG PO CPDR
40.0000 mg | DELAYED_RELEASE_CAPSULE | Freq: Every day | ORAL | 3 refills | Status: AC
  Filled 2024-04-30 – 2024-05-01 (×2): qty 90, 90d supply, fill #0
  Filled 2024-05-28 – 2024-06-22 (×2): qty 30, 30d supply, fill #0
  Filled 2024-07-23: qty 30, 30d supply, fill #1

## 2024-05-01 ENCOUNTER — Other Ambulatory Visit: Payer: Self-pay

## 2024-05-01 ENCOUNTER — Other Ambulatory Visit (HOSPITAL_COMMUNITY): Payer: Self-pay

## 2024-05-02 ENCOUNTER — Other Ambulatory Visit (HOSPITAL_BASED_OUTPATIENT_CLINIC_OR_DEPARTMENT_OTHER): Payer: Self-pay

## 2024-05-02 ENCOUNTER — Other Ambulatory Visit: Payer: Self-pay

## 2024-05-03 ENCOUNTER — Other Ambulatory Visit: Payer: Self-pay

## 2024-05-14 ENCOUNTER — Other Ambulatory Visit: Payer: Self-pay

## 2024-05-16 ENCOUNTER — Other Ambulatory Visit (HOSPITAL_COMMUNITY): Payer: Self-pay

## 2024-05-16 MED ORDER — ETHAMBUTOL HCL 400 MG PO TABS
1200.0000 mg | ORAL_TABLET | ORAL | 3 refills | Status: DC
  Filled 2024-05-16: qty 90, 90d supply, fill #0

## 2024-05-16 MED ORDER — FAMOTIDINE 40 MG PO TABS: 40.0000 mg | ORAL_TABLET | Freq: Two times a day (BID) | ORAL | 2 refills | Status: AC

## 2024-05-16 MED ORDER — FAMOTIDINE 40 MG PO TABS
40.0000 mg | ORAL_TABLET | Freq: Two times a day (BID) | ORAL | 2 refills | Status: AC | PRN
  Filled 2024-08-14: qty 180, 90d supply, fill #0
  Filled ????-??-??: fill #0

## 2024-05-17 ENCOUNTER — Other Ambulatory Visit: Payer: Self-pay

## 2024-05-18 ENCOUNTER — Other Ambulatory Visit (HOSPITAL_COMMUNITY): Payer: Self-pay

## 2024-05-18 MED ORDER — LIDOCAINE 5 % EX PTCH
1.0000 | MEDICATED_PATCH | Freq: Every day | CUTANEOUS | 1 refills | Status: AC
  Filled 2024-05-18 – 2024-05-22 (×2): qty 30, 30d supply, fill #0

## 2024-05-21 ENCOUNTER — Other Ambulatory Visit: Payer: Self-pay

## 2024-05-22 ENCOUNTER — Other Ambulatory Visit (HOSPITAL_COMMUNITY): Payer: Self-pay

## 2024-05-24 ENCOUNTER — Other Ambulatory Visit (HOSPITAL_COMMUNITY): Payer: Self-pay

## 2024-05-24 ENCOUNTER — Other Ambulatory Visit: Payer: Self-pay

## 2024-05-25 ENCOUNTER — Other Ambulatory Visit (HOSPITAL_COMMUNITY): Payer: Self-pay

## 2024-05-28 ENCOUNTER — Other Ambulatory Visit: Payer: Self-pay

## 2024-05-29 ENCOUNTER — Other Ambulatory Visit: Payer: Self-pay

## 2024-05-29 ENCOUNTER — Other Ambulatory Visit (HOSPITAL_COMMUNITY): Payer: Self-pay

## 2024-05-29 MED ORDER — FAMOTIDINE 40 MG PO TABS
40.0000 mg | ORAL_TABLET | Freq: Two times a day (BID) | ORAL | 3 refills | Status: AC
  Filled 2024-05-29 – 2024-07-26 (×2): qty 180, 90d supply, fill #0
  Filled 2024-07-26: qty 60, 30d supply, fill #0
  Filled 2024-08-14: qty 60, 30d supply, fill #1

## 2024-05-29 MED ORDER — RIFAMPIN 300 MG PO CAPS
600.0000 mg | ORAL_CAPSULE | ORAL | 3 refills | Status: DC
  Filled 2024-05-29: qty 78, 91d supply, fill #0
  Filled 2024-05-30: qty 77, 89d supply, fill #0

## 2024-05-30 ENCOUNTER — Other Ambulatory Visit: Payer: Self-pay

## 2024-05-30 ENCOUNTER — Other Ambulatory Visit (HOSPITAL_COMMUNITY): Payer: Self-pay

## 2024-06-01 ENCOUNTER — Other Ambulatory Visit: Payer: Self-pay

## 2024-06-01 ENCOUNTER — Other Ambulatory Visit (HOSPITAL_COMMUNITY): Payer: Self-pay

## 2024-06-04 ENCOUNTER — Other Ambulatory Visit (HOSPITAL_COMMUNITY): Payer: Self-pay

## 2024-06-11 ENCOUNTER — Other Ambulatory Visit (HOSPITAL_COMMUNITY): Payer: Self-pay

## 2024-06-13 ENCOUNTER — Other Ambulatory Visit (HOSPITAL_COMMUNITY): Payer: Self-pay

## 2024-06-14 ENCOUNTER — Other Ambulatory Visit: Payer: Self-pay

## 2024-06-14 ENCOUNTER — Other Ambulatory Visit (HOSPITAL_COMMUNITY): Payer: Self-pay

## 2024-06-14 MED ORDER — NEOMYCIN-POLYMYXIN-DEXAMETH 0.1 % OP SUSP
1.0000 [drp] | Freq: Three times a day (TID) | OPHTHALMIC | 0 refills | Status: AC
  Filled 2024-06-14: qty 5, 7d supply, fill #0

## 2024-06-15 ENCOUNTER — Other Ambulatory Visit (HOSPITAL_COMMUNITY): Payer: Self-pay

## 2024-06-21 ENCOUNTER — Other Ambulatory Visit: Payer: Self-pay

## 2024-06-21 ENCOUNTER — Other Ambulatory Visit (HOSPITAL_COMMUNITY): Payer: Self-pay

## 2024-06-21 MED ORDER — HYDROXYZINE HCL 10 MG PO TABS
10.0000 mg | ORAL_TABLET | Freq: Three times a day (TID) | ORAL | 1 refills | Status: DC | PRN
  Filled 2024-06-21: qty 30, 10d supply, fill #0
  Filled 2024-07-26 (×2): qty 30, 10d supply, fill #1

## 2024-06-22 ENCOUNTER — Other Ambulatory Visit (HOSPITAL_COMMUNITY): Payer: Self-pay

## 2024-06-22 ENCOUNTER — Other Ambulatory Visit: Payer: Self-pay

## 2024-06-24 ENCOUNTER — Other Ambulatory Visit: Payer: Self-pay

## 2024-06-26 ENCOUNTER — Other Ambulatory Visit (HOSPITAL_COMMUNITY): Payer: Self-pay

## 2024-06-26 ENCOUNTER — Other Ambulatory Visit: Payer: Self-pay

## 2024-06-26 MED ORDER — BUPROPION HCL ER (XL) 150 MG PO TB24
150.0000 mg | ORAL_TABLET | Freq: Every morning | ORAL | 3 refills | Status: AC
  Filled 2024-06-26: qty 100, 100d supply, fill #0

## 2024-06-26 MED ORDER — DEBROX 6.5 % OT SOLN
5.0000 [drp] | Freq: Two times a day (BID) | OTIC | 2 refills | Status: AC
  Filled 2024-06-26: qty 15, 30d supply, fill #0

## 2024-06-27 ENCOUNTER — Other Ambulatory Visit (HOSPITAL_COMMUNITY): Payer: Self-pay

## 2024-06-27 ENCOUNTER — Other Ambulatory Visit: Payer: Self-pay

## 2024-06-28 ENCOUNTER — Other Ambulatory Visit: Payer: Self-pay

## 2024-07-09 ENCOUNTER — Other Ambulatory Visit (HOSPITAL_COMMUNITY): Payer: Self-pay

## 2024-07-17 ENCOUNTER — Other Ambulatory Visit (HOSPITAL_COMMUNITY): Payer: Self-pay

## 2024-07-18 ENCOUNTER — Other Ambulatory Visit: Payer: Self-pay

## 2024-07-23 ENCOUNTER — Other Ambulatory Visit: Payer: Self-pay

## 2024-07-24 ENCOUNTER — Other Ambulatory Visit: Payer: Self-pay

## 2024-07-24 ENCOUNTER — Other Ambulatory Visit (HOSPITAL_COMMUNITY): Payer: Self-pay

## 2024-07-24 MED ORDER — MIRTAZAPINE 15 MG PO TABS
15.0000 mg | ORAL_TABLET | Freq: Every evening | ORAL | 2 refills | Status: AC
  Filled 2024-07-24: qty 30, 30d supply, fill #0

## 2024-07-25 ENCOUNTER — Other Ambulatory Visit: Payer: Self-pay

## 2024-07-26 ENCOUNTER — Other Ambulatory Visit (HOSPITAL_COMMUNITY): Payer: Self-pay

## 2024-07-26 ENCOUNTER — Other Ambulatory Visit: Payer: Self-pay

## 2024-08-14 ENCOUNTER — Other Ambulatory Visit: Payer: Self-pay

## 2024-08-14 MED ORDER — HYDROXYZINE HCL 10 MG PO TABS
10.0000 mg | ORAL_TABLET | Freq: Three times a day (TID) | ORAL | 1 refills | Status: AC | PRN
  Filled 2024-08-14: qty 30, 10d supply, fill #0
